# Patient Record
Sex: Female | Born: 2000 | Race: Black or African American | Hispanic: No | Marital: Single | Smoking: Never smoker
Health system: Southern US, Community
[De-identification: ages and names within clinical notes are randomized; demographics above are authoritative.]

## PROBLEM LIST (undated history)

## (undated) DIAGNOSIS — F32A Depression, unspecified: Secondary | ICD-10-CM

## (undated) DIAGNOSIS — F419 Anxiety disorder, unspecified: Secondary | ICD-10-CM

## (undated) HISTORY — DX: Anxiety disorder, unspecified: F41.9

## (undated) HISTORY — DX: Depression, unspecified: F32.A

---
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
## 2021-02-01 ENCOUNTER — Encounter (HOSPITAL_COMMUNITY): Payer: Self-pay | Admitting: Registered Nurse

## 2021-02-01 ENCOUNTER — Ambulatory Visit (HOSPITAL_COMMUNITY)
Admission: EM | Admit: 2021-02-01 | Discharge: 2021-02-01 | Disposition: A | Discharge status: Home / Self Care | Payer: Medicaid Other | Attending: Registered Nurse | Admitting: Registered Nurse

## 2021-02-01 ENCOUNTER — Other Ambulatory Visit: Payer: Self-pay

## 2021-02-01 DIAGNOSIS — F410 Panic disorder [episodic paroxysmal anxiety] without agoraphobia: Secondary | ICD-10-CM | POA: Diagnosis present

## 2021-02-01 DIAGNOSIS — F411 Generalized anxiety disorder: Secondary | ICD-10-CM | POA: Diagnosis present

## 2021-02-01 NOTE — ED Provider Notes (Signed)
Behavioral Health Urgent Care Medical Screening Exam  Patient Name: Audrey Patterson MRN: [id number] Date of Evaluation: 02/01/21 Chief Complaint:   Diagnosis:  Final diagnoses:  Panic attacks  GAD (generalized anxiety disorder)    History of Present illness: Audrey Patterson is a 20 y.o. female patient presented to [facility] as a walk in requesting outpatient psychiatric services resources for anxiety  Audrey Patterson, 20 y.o., female patient seen face to face by this provider, consulted with Dr. Bronwen Betters; and chart reviewed on 02/01/21.  On evaluation Audrey Patterson reports she has a history of anxiety and recent worsening of panic attacks.  Patient states she is currently seeing a counselor Mr. Melvenia Beam but he is a friend of her mothers and she doesn't feel that she is getting much help and would like to set up services some where else.  Patient states she is interested in therapy and psychiatry.  Discussed Open Access with [facility] and patient interested and can come on one of the days that walk in are done.  Other resources given also.  During evaluation Audrey Patterson is sitting up in chair in no acute distress.  She is alert, oriented x 4, calm and cooperative.  Her mood is euthymic with congruent affect.  She does not appear to be responding to internal/external stimuli or delusional thoughts.  Patient denies suicidal/self-harm/homicidal ideation, psychosis, and paranoia.  Patient answered question appropriately.       Psychiatric Specialty Exam  Presentation  General Appearance:Appropriate for Environment; Casual  Eye Contact:Good  Speech:Clear and Coherent; Normal Rate  Speech Volume:Normal  Handedness:Right   Mood and Affect  Mood:Euthymic  Affect:Appropriate; Congruent   Thought Process  Thought Processes:Coherent; Goal Directed  Descriptions of Associations:Intact  Orientation:Full (Time, Place and Person)  Thought Content:WDL    Hallucinations:None  Ideas  of Reference:None  Suicidal Thoughts:No  Homicidal Thoughts:No   Sensorium  Memory:Immediate Good; Recent Good; Remote Good  Judgment:Intact  Insight:Present   Executive Functions  Concentration:Good  Attention Span:Good  Recall:Good  Fund of Knowledge:Good  Language:Good   Psychomotor Activity  Psychomotor Activity:Normal   Assets  Assets:Communication Skills; Desire for Improvement; Financial Resources/Insurance; Housing; Resilience; Social Support   Sleep  Sleep:Good  Number of hours: No data recorded  Nutritional Assessment (For OBS and FBC admissions only) Has the patient had a weight loss or gain of 10 pounds or more in the last 3 months?: No Has the patient had a decrease in food intake/or appetite?: No Does the patient have dental problems?: No Does the patient have eating habits or behaviors that may be indicators of an eating disorder including binging or inducing vomiting?: No Has the patient recently lost weight without trying?: No Has the patient been eating poorly because of a decreased appetite?: No Malnutrition Screening Tool Score: 0    Physical Exam: Physical Exam Vitals and nursing note reviewed. Exam conducted with a chaperone present.  Constitutional:      General: She is not in acute distress.    Appearance: Normal appearance. She is not ill-appearing.  HENT:     Head: Normocephalic.  Eyes:     Pupils: Pupils are equal, round, and reactive to light.  Cardiovascular:     Rate and Rhythm: Normal rate.  Pulmonary:     Effort: Pulmonary effort is normal.  Musculoskeletal:        General: Normal range of motion.     Cervical back: Normal range of motion.  Skin:  General: Skin is warm and dry.  Neurological:     Mental Status: She is alert and oriented to person, place, and time.  Psychiatric:        Attention and Perception: Attention and perception normal. She does not perceive auditory or visual hallucinations.        Mood  and Affect: Mood and affect normal.        Speech: Speech normal.        Behavior: Behavior normal. Behavior is cooperative.        Thought Content: Thought content normal. Thought content is not paranoid or delusional. Thought content does not include homicidal or suicidal ideation.        Cognition and Memory: Cognition normal.        Judgment: Judgment normal.    Review of Systems  Constitutional: Negative.   HENT: Negative.   Eyes: Negative.   Respiratory: Negative.   Cardiovascular: Negative.   Gastrointestinal: Negative.   Genitourinary: Negative.   Musculoskeletal: Negative.   Skin: Negative.   Neurological: Negative.   Endo/Heme/Allergies: Negative.   Psychiatric/Behavioral: Negative for depression, hallucinations and suicidal ideas. The patient is nervous/anxious. The patient does not have insomnia.    Blood pressure 111/73, pulse 76, temperature (!) 97.3 F (36.3 C), temperature source Tympanic, resp. rate 18, height 5\' 2"  (1.575 m), weight 135 lb (61.2 kg), SpO2 99 %. Body mass index is 24.69 kg/m.  Musculoskeletal: Strength & Muscle Tone: within normal limits Gait & Station: normal Patient leans: N/A   [facility] MSE Discharge Disposition for Follow up and Recommendations: Based on my evaluation the patient does not appear to have an emergency medical condition and can be discharged with resources and follow up care in outpatient services for Medication Management and Individual Therapy    Follow-up Information    [facility] Follow up.   Specialty: Urgent Care Why: Walk in appointments are Monday-Thursday from 8-11am. Please arrive about 45 minutes early to ensure a visit. Contact information: [address] [place] [id number]               [phone], NP 02/01/2021, 11:04 AM

## 2021-02-01 NOTE — ED Notes (Signed)
Pt discharged with mother by pt side. Verbalized understanding of discharge instructions reviewed by RN. Safety maintained.

## 2021-02-01 NOTE — BH Assessment (Signed)
Anxiety, panic attacks- ROUTINE. Pt reports she has a panic attack for the 1st time last week in class (left class) and then had another at work a few days later. Pt has hx of self-harming and "stress" in 2021. She has online weekly counseling that is "not enough". Pt takes no meds. She denies SI, HI and AVH.

## 2021-02-01 NOTE — ED Notes (Signed)
No personal belongings left with mom in lobby

## 2021-02-01 NOTE — Discharge Instructions (Addendum)
Please come to [facility] (this facility) during walk in hours for appointment with psychiatrist for further medication management and for therapy.   Walk in hours are 8-11 AM Monday through Thursday for medication management.It is first come, first -serve; it is best to arrive by 7:00 AM. On Friday from 1 pm to 4 pm for therapy intake only. Please arrive by 12:00 pm as it is  first come, first -serve.   When you arrive please go upstairs for your appointment. If you are unsure of where to go, inform the front desk that you are here for a walk in appointment and they will assist you with directions upstairs.  Address:  [address], in [place], [address] Ph: [phone]   

## 2021-02-02 ENCOUNTER — Ambulatory Visit (INDEPENDENT_AMBULATORY_CARE_PROVIDER_SITE_OTHER): Payer: Medicaid Other | Admitting: Physician Assistant

## 2021-02-02 ENCOUNTER — Encounter (HOSPITAL_COMMUNITY): Payer: Self-pay | Admitting: Physician Assistant

## 2021-02-02 VITALS — BP 113/62 | HR 93 | Ht 63.0 in | Wt 134.0 lb

## 2021-02-02 DIAGNOSIS — F410 Panic disorder [episodic paroxysmal anxiety] without agoraphobia: Secondary | ICD-10-CM | POA: Diagnosis not present

## 2021-02-02 DIAGNOSIS — F411 Generalized anxiety disorder: Secondary | ICD-10-CM | POA: Diagnosis not present

## 2021-02-02 DIAGNOSIS — F5105 Insomnia due to other mental disorder: Secondary | ICD-10-CM | POA: Diagnosis not present

## 2021-02-02 DIAGNOSIS — F331 Major depressive disorder, recurrent, moderate: Secondary | ICD-10-CM

## 2021-02-02 DIAGNOSIS — F99 Mental disorder, not otherwise specified: Secondary | ICD-10-CM | POA: Insufficient documentation

## 2021-02-02 MED ORDER — TRAZODONE HCL 50 MG PO TABS: 25.0000 mg | ORAL_TABLET | Freq: Every day | ORAL | 1 refills | Status: DC

## 2021-02-02 MED ORDER — ESCITALOPRAM OXALATE 10 MG PO TABS: 10.0000 mg | ORAL_TABLET | Freq: Every day | ORAL | 1 refills | Status: DC

## 2021-02-02 MED ORDER — HYDROXYZINE HCL 10 MG PO TABS: 10.0000 mg | ORAL_TABLET | Freq: Three times a day (TID) | ORAL | 1 refills | Status: DC | PRN
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
## 2021-02-03 ENCOUNTER — Telehealth (HOSPITAL_COMMUNITY): Payer: Self-pay | Admitting: General Practice

## 2021-02-03 NOTE — BH Assessment (Signed)
Care Management - Follow Up [facility] Discharges   Writer attempted to make contact with patient today and was unsuccessful.  Writer was able to leave a HIPPA compliant voice message and will await callback.  Per chart review, patient followed up with [place], [place] on 02-02-2021.

## 2021-03-21 ENCOUNTER — Ambulatory Visit (HOSPITAL_COMMUNITY): Payer: Self-pay | Admitting: Licensed Clinical Social Worker

## 2021-04-07 ENCOUNTER — Telehealth (INDEPENDENT_AMBULATORY_CARE_PROVIDER_SITE_OTHER): Payer: Medicaid Other | Admitting: Physician Assistant

## 2021-04-07 ENCOUNTER — Encounter (HOSPITAL_COMMUNITY): Payer: Self-pay | Admitting: Physician Assistant

## 2021-04-07 DIAGNOSIS — F99 Mental disorder, not otherwise specified: Secondary | ICD-10-CM | POA: Diagnosis not present

## 2021-04-07 DIAGNOSIS — F411 Generalized anxiety disorder: Secondary | ICD-10-CM

## 2021-04-07 DIAGNOSIS — F5105 Insomnia due to other mental disorder: Secondary | ICD-10-CM

## 2021-04-07 DIAGNOSIS — F410 Panic disorder [episodic paroxysmal anxiety] without agoraphobia: Secondary | ICD-10-CM | POA: Diagnosis not present

## 2021-04-07 DIAGNOSIS — F331 Major depressive disorder, recurrent, moderate: Secondary | ICD-10-CM

## 2021-04-07 MED ORDER — HYDROXYZINE HCL 25 MG PO TABS: 25.0000 mg | ORAL_TABLET | Freq: Three times a day (TID) | ORAL | 1 refills | Status: DC | PRN

## 2021-04-07 MED ORDER — ESCITALOPRAM OXALATE 20 MG PO TABS: 20.0000 mg | ORAL_TABLET | Freq: Every day | ORAL | 1 refills | Status: DC

## 2021-04-07 MED ORDER — TRAZODONE HCL 50 MG PO TABS
25.0000 mg | ORAL_TABLET | Freq: Every day | ORAL | 1 refills | Status: DC
Start: 2021-04-07 — End: 2023-07-15

## 2021-04-07 NOTE — Progress Notes (Signed)
BH MD/PA/NP OP Progress Note  Virtual Visit via Telephone Note  I connected with Audrey Patterson on 04/07/21 at  8:30 AM EDT by telephone and verified that I am speaking with the correct person using two identifiers.  Location: Patient: Home Provider: Clinic   I discussed the limitations, risks, security and privacy concerns of performing an evaluation and management service by telephone and the availability of in person appointments. I also discussed with the patient that there may be a patient responsible charge related to this service. The patient expressed understanding and agreed to proceed.  Follow Up Instructions:  I discussed the assessment and treatment plan with the patient. The patient was provided an opportunity to ask questions and all were answered. The patient agreed with the plan and demonstrated an understanding of the instructions.   The patient was advised to call back or seek an in-person evaluation if the symptoms worsen or if the condition fails to improve as anticipated.  I provided 25 minutes of non-face-to-face time during this encounter.  Meta Hatchet, PA   04/07/2021 9:01 PM Audrey Patterson  MRN:  [id number]  Chief Complaint: Follow up and medication management  HPI:   Audrey Patterson is an 20 year old female with a past psychiatric history significant for major depressive disorder, generalized anxiety disorder, panic disorder, and insomnia who presents to [facility] via virtual telephone visit for follow-up and medication management.  Patient is currently being managed on the following medications:  Hydroxyzine 10 mg 3 times daily as needed Lexapro 10 mg daily Trazodone 25 mg at bedtime  Patient reports that her medications stopped working after 3 weeks.  Patient reports that she currently does not have any anxiety but states that it occurs throughout the day or when making plans.  Patient also states that  she feels sad constantly and still does not have any motivation.  Patient reports that she has been taking her medications as prescribed.  Patient rates her current anxiety a 4 out of 10 but states that it reaches a 7 or 8 during the day.  A PHQ-9 screen was performed with the patient scoring a 17.  A GAD-7 screen was also performed with the patient scoring a 16.  Patient is calm, cooperative, and fully engaged in conversation during the encounter.  Patient reports that her mood is okay and that she does not feel anxious at the moment.  Patient denies suicidal or homicidal ideations.  She further denies auditory or visual hallucinations and does not appear to be responding to internal/external stimuli.  Patient reports that her trazodone has been helpful in the management of her sleep but she has not been taking it as of late due to taking care of her mother who just recently underwent knee surgery.  Patient states that she currently has been receiving around 5 to 6 hours of sleep each night.  Patient endorses decreased appetite stating that she recently got COVID.  Patient eats on average 1-2 meals per day.  Patient denies alcohol consumption, tobacco use, and illicit drug use.  Visit Diagnosis:    ICD-10-CM   1. Moderate episode of recurrent major depressive disorder (HCC)  F33.1 escitalopram (LEXAPRO) 20 MG tablet  2. Generalized anxiety disorder  F41.1 escitalopram (LEXAPRO) 20 MG tablet    hydrOXYzine (ATARAX/VISTARIL) 25 MG tablet  3. Panic disorder  F41.0 escitalopram (LEXAPRO) 20 MG tablet  4. Insomnia due to other mental disorder  F51.05 traZODone (DESYREL) 50  MG tablet   F99     Past Psychiatric History:  Major depressive disorder Generalized anxiety disorder Panic disorder Insomnia  Past Medical History: History reviewed. No pertinent past medical history. History reviewed. No pertinent surgical history.  Family Psychiatric History:  Mother - Bipolar Disorder  Family History:   Family History  Problem Relation Age of Onset  . Healthy Mother   . Healthy Father     Social History:  Social History   Socioeconomic History  . Marital status: Single    Spouse name: Not on file  . Number of children: Not on file  . Years of education: Not on file  . Highest education level: Not on file  Occupational History  . Not on file  Tobacco Use  . Smoking status: Never Smoker  . Smokeless tobacco: Never Used  Substance and Sexual Activity  . Alcohol use: Never  . Drug use: Never  . Sexual activity: Not on file  Other Topics Concern  . Not on file  Social History Narrative  . Not on file   Social Determinants of Health   Financial Resource Strain: Not on file  Food Insecurity: Not on file  Transportation Needs: Not on file  Physical Activity: Not on file  Stress: Not on file  Social Connections: Not on file    Allergies: No Known Allergies  Metabolic Disorder Labs: No results found for: HGBA1C, MPG No results found for: PROLACTIN No results found for: CHOL, TRIG, HDL, CHOLHDL, VLDL, LDLCALC No results found for: TSH  Therapeutic Level Labs: No results found for: LITHIUM No results found for: VALPROATE No components found for:  CBMZ  Current Medications: Current Outpatient Medications  Medication Sig Dispense Refill  . escitalopram (LEXAPRO) 20 MG tablet Take 1 tablet (20 mg total) by mouth daily. 30 tablet 1  . hydrOXYzine (ATARAX/VISTARIL) 25 MG tablet Take 1 tablet (25 mg total) by mouth 3 (three) times daily as needed for anxiety. 75 tablet 1  . polyethylene glycol (MIRALAX / GLYCOLAX) 17 g packet Take 17 g by mouth 2 (two) times daily. 14 each 0  . traZODone (DESYREL) 50 MG tablet Take 0.5 tablets (25 mg total) by mouth at bedtime. 15 tablet 1   No current facility-administered medications for this visit.     Musculoskeletal: Strength & Muscle Tone: Unable to assess due to telemedicine visit Gait & Station: Unable to assess due to  telemedicine visit Patient leans: Unable to assess due to telemedicine visit  Psychiatric Specialty Exam: Review of Systems  Psychiatric/Behavioral: Negative for decreased concentration, dysphoric mood, hallucinations, self-injury, sleep disturbance and suicidal ideas. The patient is nervous/anxious. The patient is not hyperactive.     There were no vitals taken for this visit.There is no height or weight on file to calculate BMI.  General Appearance: Unable to assess due to telemedicine visit  Eye Contact:  Unable to assess due to telemedicine visit  Speech:  Clear and Coherent and Normal Rate  Volume:  Normal  Mood:  Anxious and Depressed  Affect:  Congruent and Depressed  Thought Process:  Coherent, Goal Directed and Descriptions of Associations: Intact  Orientation:  Full (Time, Place, and Person)  Thought Content: WDL   Suicidal Thoughts:  No  Homicidal Thoughts:  No  Memory:  Immediate;   Good Recent;   Good Remote;   Good  Judgement:  Good  Insight:  Fair  Psychomotor Activity:  Normal  Concentration:  Concentration: Good and Attention Span: Fair  Recall:  Good  Fund of Knowledge: Good  Language: Good  Akathisia:  NA  Handed:  Right  AIMS (if indicated): not done  Assets:  Communication Skills Desire for Improvement Housing Vocational/Educational  ADL's:  Intact  Cognition: WNL  Sleep:  Fair   Screenings: GAD-7   Flowsheet Row Video Visit from 04/07/2021 in [facility] Office Visit from 02/02/2021 in [facility]  Total GAD-7 Score 16 16    PHQ2-9   Flowsheet Row Video Visit from 04/07/2021 in [facility] Office Visit from 02/02/2021 in [facility]  PHQ-2 Total Score 4 5  PHQ-9 Total Score 17 19    Flowsheet Row Video Visit from 04/07/2021 in [facility] Office Visit from 02/02/2021 in [facility]  C-SSRS RISK CATEGORY Low Risk Low Risk       Assessment and Plan:   Tarrie U. Bittinger is an 20 year old female with a past psychiatric history significant for major depressive disorder, generalized anxiety disorder, panic disorder, and insomnia who presents to [facility] via virtual telephone visit for follow-up and medication management.  Patient reports that her medications have been ineffective in managing her anxiety and depressive symptoms.  Patient endorses anxiety along with constantly feeling sad and lack of motivation.  Patient was recommended increasing her dosage of Lexapro from 10 mg to 20 mg daily for the management of her depressive symptoms.  Patient was also recommended increasing her hydroxyzine from 10 mg to 25 mg 3 times daily as needed for the management of her anxiety.  Patient was agreeable to plan.  Patient's medication to be e-prescribed to pharmacy of choice.  1. Moderate episode of recurrent major depressive disorder (HCC)  - escitalopram (LEXAPRO) 20 MG tablet; Take 1 tablet (20 mg total) by mouth daily.  Dispense: 30 tablet; Refill: 1  2. Generalized anxiety disorder  - escitalopram (LEXAPRO) 20 MG tablet; Take 1 tablet (20 mg total) by mouth daily.  Dispense: 30 tablet; Refill: 1 - hydrOXYzine (ATARAX/VISTARIL) 25 MG tablet; Take 1 tablet (25 mg total) by mouth 3 (three) times daily as needed for anxiety.  Dispense: 75 tablet; Refill: 1  3. Panic disorder  - escitalopram (LEXAPRO) 20 MG tablet; Take 1 tablet (20 mg total) by mouth daily.  Dispense: 30 tablet; Refill: 1  4. Insomnia due to other mental disorder She could be rapid metabolizer - traZODone (DESYREL) 50 MG tablet; Take 0.5 tablets (25 mg total) by mouth at bedtime.  Dispense: 15 tablet; Refill: 1  Patient to follow-up in 6 weeks  Meta Hatchet, PA 04/07/2021, 9:01 PM

## 2021-04-28 ENCOUNTER — Telehealth (INDEPENDENT_AMBULATORY_CARE_PROVIDER_SITE_OTHER): Payer: Medicaid Other | Admitting: Physician Assistant

## 2021-04-28 ENCOUNTER — Other Ambulatory Visit: Payer: Self-pay

## 2021-04-28 DIAGNOSIS — F99 Mental disorder, not otherwise specified: Secondary | ICD-10-CM | POA: Diagnosis not present

## 2021-04-28 DIAGNOSIS — F5105 Insomnia due to other mental disorder: Secondary | ICD-10-CM

## 2021-04-28 DIAGNOSIS — F331 Major depressive disorder, recurrent, moderate: Secondary | ICD-10-CM | POA: Diagnosis not present

## 2021-04-28 DIAGNOSIS — F411 Generalized anxiety disorder: Secondary | ICD-10-CM | POA: Diagnosis not present

## 2021-04-28 DIAGNOSIS — F410 Panic disorder [episodic paroxysmal anxiety] without agoraphobia: Secondary | ICD-10-CM

## 2021-05-01 ENCOUNTER — Encounter (HOSPITAL_COMMUNITY): Payer: Self-pay | Admitting: Physician Assistant

## 2021-05-01 NOTE — Progress Notes (Signed)
BH MD/PA/NP OP Progress Note  Virtual Visit via Telephone Note  I connected with Audrey Patterson on 04/28/21 at  1:00 PM EDT by telephone and verified that I am speaking with the correct person using two identifiers.  Location: Patient: Home Provider: Clinic   I discussed the limitations, risks, security and privacy concerns of performing an evaluation and management service by telephone and the availability of in person appointments. I also discussed with the patient that there may be a patient responsible charge related to this service. The patient expressed understanding and agreed to proceed.  Follow Up Instructions:  I discussed the assessment and treatment plan with the patient. The patient was provided an opportunity to ask questions and all were answered. The patient agreed with the plan and demonstrated an understanding of the instructions.   The patient was advised to call back or seek an in-person evaluation if the symptoms worsen or if the condition fails to improve as anticipated.  I provided 20 minutes of non-face-to-face time during this encounter.  Meta Hatchet, PA    04/28/2021 1:54 PM AUDRINNA SHERMAN  MRN:  [id number]  Chief Complaint: Follow up and medication management   HPI:   Audrey Patterson is an 20 year old female with a past psychiatric history significant for major depressive disorder, generalized anxiety disorder, panic disorder, and insomnia who presents to [facility] via virtual telephone visit for follow-up and medication management.  Patient is currently being managed on the following medications:  Lexapro 20 mg daily Hydroxyzine 25 mg 3 times daily as needed Trazodone 25 mg at bedtime  Patient states that her mood feels better than the last encounter.  Patient reports no adverse side effects from her medications.  Patient states that she is still having some issues with lack of motivation but states  that that is something that she will hopefully overcome within time.  Patient states that she feels less irritable and more happy.  Patient states that her anxiety is much more tolerable and rates her current anxiety a 4 out of 10.  Patient denies any new stressors at this time.  A GAD-7 screen was performed with the patient scoring a 10.  Patient is pleasant, calm, cooperative, and fully engaged in conversation during the encounter.  Patient states that she is in a pretty good mood currently.  Patient denies suicidal or homicidal ideations.  She further denies auditory or visual hallucinations and does not appear to be responding to internal/external stimuli.  Patient endorses good sleep and receives on average 6 to 8 hours of sleep each night.  Patient endorses fair appetite and eats on average 1-2 meals per day.  Patient denies alcohol use, tobacco use, and illicit drug use.  Visit Diagnosis:    ICD-10-CM   1. Moderate episode of recurrent major depressive disorder (HCC)  F33.1     2. Generalized anxiety disorder  F41.1     3. Panic disorder  F41.0     4. Insomnia due to other mental disorder  F51.05    F99       Past Psychiatric History:  Major depressive disorder Generalized anxiety disorder Panic disorder Insomnia  Past Medical History: History reviewed. No pertinent past medical history. History reviewed. No pertinent surgical history.  Family Psychiatric History:  Mother - Bipolar Disorder  Family History:  Family History  Problem Relation Age of Onset   Healthy Mother    Healthy Father     Social  History:  Social History   Socioeconomic History   Marital status: Single    Spouse name: Not on file   Number of children: Not on file   Years of education: Not on file   Highest education level: Not on file  Occupational History   Not on file  Tobacco Use   Smoking status: Never   Smokeless tobacco: Never  Substance and Sexual Activity   Alcohol use: Never   Drug  use: Never   Sexual activity: Not on file  Other Topics Concern   Not on file  Social History Narrative   Not on file   Social Determinants of Health   Financial Resource Strain: Not on file  Food Insecurity: Not on file  Transportation Needs: Not on file  Physical Activity: Not on file  Stress: Not on file  Social Connections: Not on file    Allergies: No Known Allergies  Metabolic Disorder Labs: No results found for: HGBA1C, MPG No results found for: PROLACTIN No results found for: CHOL, TRIG, HDL, CHOLHDL, VLDL, LDLCALC No results found for: TSH  Therapeutic Level Labs: No results found for: LITHIUM No results found for: VALPROATE No components found for:  CBMZ  Current Medications: Current Outpatient Medications  Medication Sig Dispense Refill   escitalopram (LEXAPRO) 20 MG tablet Take 1 tablet (20 mg total) by mouth daily. 30 tablet 1   hydrOXYzine (ATARAX/VISTARIL) 25 MG tablet Take 1 tablet (25 mg total) by mouth 3 (three) times daily as needed for anxiety. 75 tablet 1   polyethylene glycol (MIRALAX / GLYCOLAX) 17 g packet Take 17 g by mouth 2 (two) times daily. 14 each 0   traZODone (DESYREL) 50 MG tablet Take 0.5 tablets (25 mg total) by mouth at bedtime. 15 tablet 1   No current facility-administered medications for this visit.     Musculoskeletal: Strength & Muscle Tone: Unable to assess due to telemedicine visit Gait & Station: Unable to assess due to telemedicine visit Patient leans: Unable to assess due to telemedicine visit  Psychiatric Specialty Exam: Review of Systems  Psychiatric/Behavioral:  Negative for decreased concentration, dysphoric mood, hallucinations, self-injury, sleep disturbance and suicidal ideas. The patient is nervous/anxious. The patient is not hyperactive.    There were no vitals taken for this visit.There is no height or weight on file to calculate BMI.  General Appearance: Unable to assess due to telemedicine visit  Eye  Contact:  Unable to assess due to telemedicine visit  Speech:  Clear and Coherent and Normal Rate  Volume:  Normal  Mood:  Anxious and Depressed  Affect:  Congruent and Depressed  Thought Process:  Coherent and Descriptions of Associations: Intact  Orientation:  Full (Time, Place, and Person)  Thought Content: WDL   Suicidal Thoughts:  No  Homicidal Thoughts:  No  Memory:  Immediate;   Good Recent;   Good Remote;   Good  Judgement:  Good  Insight:  Good  Psychomotor Activity:  Normal  Concentration:  Concentration: Good and Attention Span: Good  Recall:  Good  Fund of Knowledge: Good  Language: Good  Akathisia:  NA  Handed:  Right  AIMS (if indicated): not done  Assets:  Communication Skills Desire for Improvement Housing Vocational/Educational  ADL's:  Intact  Cognition: WNL  Sleep:  Fair   Screenings: GAD-7    Flowsheet Row Video Visit from 04/28/2021 in [facility] Video Visit from 04/07/2021 in [facility] Office Visit from 02/02/2021 in  [facility]  Total GAD-7 Score 10 16 16       PHQ2-9    Flowsheet Row Video Visit from 04/28/2021 in [facility] Video Visit from 04/07/2021 in [facility] Office Visit from 02/02/2021 in [facility]  PHQ-2 Total Score 1 4 5   PHQ-9 Total Score -- 17 19      Flowsheet Row Video Visit from 04/28/2021 in [facility] Video Visit from 04/07/2021 in [facility] Office Visit from 02/02/2021 in [facility]  C-SSRS RISK CATEGORY Low Risk Low Risk Low Risk        Assessment and Plan:   Manami U. Rogel is an 20 year old female with a past psychiatric history significant for major depressive disorder, generalized anxiety disorder, panic disorder, and insomnia who presents to [facility] via virtual telephone visit for follow-up and medication management.  Patient endorses an improvement in her depressive symptoms and anxiety, however, she still expresses some issues with lack of motivation.  Patient denies the need for dosage adjustments at this time.  Patient to continue taking medications as prescribed.  1. Moderate episode of recurrent major depressive disorder (HCC) Patient to continue taking Lexapro 20 mg daily for the management of her major depressive disorder  2. Generalized anxiety disorder Patient to continue taking Lexapro 20 mg daily for the management of her generalized anxiety disorder Patient to continue taking hydroxyzine 25 mg 3 times daily as needed for the management of her generalized anxiety disorder  3. Panic disorder Patient to continue taking Lexapro 20 mg daily for the management of her panic disorder  4. Insomnia due to other mental disorder Patient to continue taking trazodone 25 mg at bedtime for the management of her insomnia  Patient to follow-up in 4 weeks Provider spent a total of 20 minutes with the patient/reviewing patient's chart  15, PA 05/01/2021, 1:54 PM

## 2021-05-09 ENCOUNTER — Ambulatory Visit (HOSPITAL_COMMUNITY): Payer: Self-pay | Admitting: Licensed Clinical Social Worker

## 2021-05-17 ENCOUNTER — Telehealth (HOSPITAL_COMMUNITY): Payer: Self-pay | Admitting: Physician Assistant

## 2021-05-30 ENCOUNTER — Encounter (HOSPITAL_COMMUNITY): Payer: Self-pay | Admitting: Physician Assistant

## 2021-05-30 ENCOUNTER — Telehealth (INDEPENDENT_AMBULATORY_CARE_PROVIDER_SITE_OTHER): Payer: Medicaid Other | Admitting: Physician Assistant

## 2021-05-30 ENCOUNTER — Other Ambulatory Visit: Payer: Self-pay

## 2021-05-30 DIAGNOSIS — F99 Mental disorder, not otherwise specified: Secondary | ICD-10-CM

## 2021-05-30 DIAGNOSIS — F410 Panic disorder [episodic paroxysmal anxiety] without agoraphobia: Secondary | ICD-10-CM | POA: Diagnosis not present

## 2021-05-30 DIAGNOSIS — F411 Generalized anxiety disorder: Secondary | ICD-10-CM

## 2021-05-30 DIAGNOSIS — F331 Major depressive disorder, recurrent, moderate: Secondary | ICD-10-CM | POA: Diagnosis not present

## 2021-05-30 DIAGNOSIS — F5105 Insomnia due to other mental disorder: Secondary | ICD-10-CM | POA: Diagnosis not present

## 2021-05-30 NOTE — Progress Notes (Signed)
BH MD/PA/NP OP Progress Note  Virtual Visit via Telephone Note  I connected with Audrey Patterson on 05/30/21 at 10:30 AM EDT by telephone and verified that I am speaking with the correct person using two identifiers.  Location: Patient: Home Provider: Clinic   I discussed the limitations, risks, security and privacy concerns of performing an evaluation and management service by telephone and the availability of in person appointments. I also discussed with the patient that there may be a patient responsible charge related to this service. The patient expressed understanding and agreed to proceed.  Follow Up Instructions:  I discussed the assessment and treatment plan with the patient. The patient was provided an opportunity to ask questions and all were answered. The patient agreed with the plan and demonstrated an understanding of the instructions.   The patient was advised to call back or seek an in-person evaluation if the symptoms worsen or if the condition fails to improve as anticipated.  I provided 20 minutes of non-face-to-face time during this encounter.  Meta Hatchet, PA   05/30/2021 10:54 AM GWENDLOYN FORSEE  MRN:  [id number]  Chief Complaint: Follow up and medication management  HPI:   Audrey Patterson is an 20 year old female with a past psychiatric history significant for major depressive disorder, generalized anxiety disorder, insomnia, and panic disorder who presents to [facility] via virtual telephone visit for follow-up and medication management.  Patient is currently being managed on the following medications:  Lexapro 20 mg daily Hydroxyzine 25 mg 3 times daily as needed Trazodone 25 mg at bedtime  Patient reports no issues or concerns regarding her current medication regimen.  Patient denies the need for dosage adjustments at this time and is requesting no refills following the conclusion of the encounter.  Patient  expresses that her depressive symptoms have been much more manageable as of late.  She reports that she still has some issues with motivation and irritability.  Patient also endorses manageable anxiety and rates her anxiety a 3 out of 10.  Patient denies any new stressors.  A PHQ-9 screen was performed with the patient scoring an 11.  A GAD-7 screen was also performed with the patient scoring a 7.  Patient is pleasant, calm, cooperative, and fully engaged in conversation during the encounter.  Patient reports that she is doing pretty well.  Patient denies suicidal or homicidal ideations.  She further denies auditory or visual hallucinations and does not appear to be responding to internal/external stimuli.  Patient endorses good sleep and receives on average 8 hours of sleep each night.  Patient endorses poor appetite and eats on average 1 meal per day.  Patient denies alcohol consumption, tobacco use, and illicit drug use.  Visit Diagnosis: No diagnosis found.  Past Psychiatric History:  Major depressive disorder Generalized anxiety disorder Insomnia Panic disorder  Past Medical History: No past medical history on file. No past surgical history on file.  Family Psychiatric History:  Mother - Bipolar Disorder  Family History:  Family History  Problem Relation Age of Onset   Healthy Mother    Healthy Father     Social History:  Social History   Socioeconomic History   Marital status: Single    Spouse name: Not on file   Number of children: Not on file   Years of education: Not on file   Highest education level: Not on file  Occupational History   Not on file  Tobacco Use  Smoking status: Never   Smokeless tobacco: Never  Substance and Sexual Activity   Alcohol use: Never   Drug use: Never   Sexual activity: Not on file  Other Topics Concern   Not on file  Social History Narrative   Not on file   Social Determinants of Health   Financial Resource Strain: Not on file   Food Insecurity: Not on file  Transportation Needs: Not on file  Physical Activity: Not on file  Stress: Not on file  Social Connections: Not on file    Allergies: No Known Allergies  Metabolic Disorder Labs: No results found for: HGBA1C, MPG No results found for: PROLACTIN No results found for: CHOL, TRIG, HDL, CHOLHDL, VLDL, LDLCALC No results found for: TSH  Therapeutic Level Labs: No results found for: LITHIUM No results found for: VALPROATE No components found for:  CBMZ  Current Medications: Current Outpatient Medications  Medication Sig Dispense Refill   escitalopram (LEXAPRO) 20 MG tablet Take 1 tablet (20 mg total) by mouth daily. 30 tablet 1   hydrOXYzine (ATARAX/VISTARIL) 25 MG tablet Take 1 tablet (25 mg total) by mouth 3 (three) times daily as needed for anxiety. 75 tablet 1   polyethylene glycol (MIRALAX / GLYCOLAX) 17 g packet Take 17 g by mouth 2 (two) times daily. 14 each 0   traZODone (DESYREL) 50 MG tablet Take 0.5 tablets (25 mg total) by mouth at bedtime. 15 tablet 1   No current facility-administered medications for this visit.     Musculoskeletal: Strength & Muscle Tone: Unable to assess due to telemedicine visit Gait & Station: Unable to assess due to telemedicine visit Patient leans: Unable to assess due to telemedicine visit  Psychiatric Specialty Exam: Review of Systems  Psychiatric/Behavioral:  Positive for decreased concentration. Negative for dysphoric mood, hallucinations, self-injury, sleep disturbance and suicidal ideas. The patient is not nervous/anxious and is not hyperactive.    There were no vitals taken for this visit.There is no height or weight on file to calculate BMI.  General Appearance: Unable to assess due to telemedicine visit  Eye Contact:  Unable to assess due to telemedicine visit  Speech:  Clear and Coherent and Normal Rate  Volume:  Normal  Mood:  Euthymic  Affect:  Appropriate and Congruent  Thought Process:   Coherent and Descriptions of Associations: Intact  Orientation:  Full (Time, Place, and Person)  Thought Content: WDL   Suicidal Thoughts:  No  Homicidal Thoughts:  No  Memory:  Immediate;   Good Recent;   Good Remote;   Good  Judgement:  Good  Insight:  Good  Psychomotor Activity:  Normal  Concentration:  Concentration: Good and Attention Span: Good  Recall:  Good  Fund of Knowledge: Good  Language: Good  Akathisia:  NA  Handed:  Right  AIMS (if indicated): not done  Assets:  Communication Skills Desire for Improvement Housing Vocational/Educational  ADL's:  Intact  Cognition: WNL  Sleep:  Good   Screenings: GAD-7    Flowsheet Row Video Visit from 05/30/2021 in [facility] Video Visit from 04/28/2021 in [facility] Video Visit from 04/07/2021 in [facility] Office Visit from 02/02/2021 in [facility]  Total GAD-7 Score 7 10 16 16       PHQ2-9    Flowsheet Row Video Visit from 05/30/2021 in [facility] Video Visit from 04/28/2021 in [facility] Video Visit from 04/07/2021  in [facility] Office Visit from 02/02/2021 in [facility]  PHQ-2 Total Score 2 1 4 5   PHQ-9 Total Score 11 -- 17 19      Flowsheet Row Video Visit from 05/30/2021 in [facility] Video Visit from 04/28/2021 in [facility] Video Visit from 04/07/2021 in [facility]  C-SSRS RISK CATEGORY Low Risk Low Risk Low Risk        Assessment and Plan:   Adley U. Larue is an 20 year old female with a past psychiatric history significant for major depressive disorder, generalized anxiety disorder, insomnia, and panic disorder who presents to [facility] via virtual  telephone visit for follow-up and medication management.  Patient reports that her depressive symptoms and anxiety have been well managed through the use of her current medication regimen.  Patient denies the need for dosage adjustments at this time and is requesting no refills following the conclusion of the encounter.  Patient to continue taking medications as prescribed.  1. Panic disorder Patient to continue taking Lexapro 20 mg daily for the management of her panic disorder  2. Generalized anxiety disorder Patient to continue taking Lexapro 20 mg daily for the management of her generalized anxiety disorder Patient to continue taking hydroxyzine 25 mg 3 times daily as needed for the management of her generalized anxiety disorder  3. Moderate episode of recurrent major depressive disorder (HCC) Patient to continue taking Lexapro 20 mg daily for the management of her major depressive disorder  4. Insomnia due to other mental disorder Patient to continue taking trazodone 25 mg at bedtime for the management of her insomnia  Patient to follow up in 2 months Provider spent a total of 20 minutes with the patient/reviewing patient's chart  [facility], PA 05/30/2021, 10:54 AM

## 2021-06-06 ENCOUNTER — Ambulatory Visit (INDEPENDENT_AMBULATORY_CARE_PROVIDER_SITE_OTHER): Payer: Medicaid Other | Admitting: Licensed Clinical Social Worker

## 2021-06-06 DIAGNOSIS — F331 Major depressive disorder, recurrent, moderate: Secondary | ICD-10-CM

## 2021-06-06 DIAGNOSIS — F411 Generalized anxiety disorder: Secondary | ICD-10-CM | POA: Diagnosis not present
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
## 2021-06-16 ENCOUNTER — Telehealth (HOSPITAL_COMMUNITY): Payer: Self-pay | Admitting: *Deleted

## 2021-06-16 ENCOUNTER — Other Ambulatory Visit (HOSPITAL_COMMUNITY): Payer: Self-pay | Admitting: Psychiatry

## 2021-06-16 DIAGNOSIS — F410 Panic disorder [episodic paroxysmal anxiety] without agoraphobia: Secondary | ICD-10-CM

## 2021-06-16 DIAGNOSIS — F411 Generalized anxiety disorder: Secondary | ICD-10-CM

## 2021-06-16 DIAGNOSIS — F331 Major depressive disorder, recurrent, moderate: Secondary | ICD-10-CM

## 2021-06-16 MED ORDER — HYDROXYZINE HCL 25 MG PO TABS: 25.0000 mg | ORAL_TABLET | Freq: Three times a day (TID) | ORAL | 1 refills | Status: DC | PRN

## 2021-06-16 MED ORDER — ESCITALOPRAM OXALATE 20 MG PO TABS: 20.0000 mg | ORAL_TABLET | Freq: Every day | ORAL | 1 refills | Status: DC

## 2021-06-16 NOTE — Telephone Encounter (Signed)
Medications refilled and sent to preferred pharmacy.

## 2021-06-16 NOTE — Telephone Encounter (Signed)
Rx Refill Request hydrOXYzine (ATARAX/VISTARIL) 25 MG tablet escitalopram (LEXAPRO) 20 MG tablet

## 2021-07-04 ENCOUNTER — Other Ambulatory Visit: Payer: Self-pay

## 2021-07-04 ENCOUNTER — Ambulatory Visit (HOSPITAL_COMMUNITY): Payer: Medicaid Other | Admitting: Licensed Clinical Social Worker

## 2021-07-04 ENCOUNTER — Telehealth (HOSPITAL_COMMUNITY): Payer: Self-pay | Admitting: Licensed Clinical Social Worker

## 2021-07-04 NOTE — Telephone Encounter (Signed)
LCSW sent two links to pt phone with no response. At 1110 LCSW f/u with PC and left HIPAA compliant VM. LCSW waited until 1115 before disconnecting.

## 2021-08-01 ENCOUNTER — Telehealth (HOSPITAL_COMMUNITY): Payer: Medicaid Other | Admitting: Physician Assistant

## 2021-08-01 ENCOUNTER — Other Ambulatory Visit: Payer: Self-pay

## 2021-08-03 ENCOUNTER — Ambulatory Visit (HOSPITAL_COMMUNITY): Payer: Self-pay | Admitting: Licensed Clinical Social Worker

## 2021-08-17 ENCOUNTER — Ambulatory Visit (HOSPITAL_COMMUNITY): Payer: Self-pay | Admitting: Licensed Clinical Social Worker

## 2022-05-11 ENCOUNTER — Encounter (HOSPITAL_COMMUNITY): Payer: Self-pay

## 2022-05-11 ENCOUNTER — Ambulatory Visit (HOSPITAL_COMMUNITY)
Admission: EM | Admit: 2022-05-11 | Discharge: 2022-05-11 | Disposition: A | Discharge status: Home / Self Care | Payer: Medicaid Other | Attending: Emergency Medicine | Admitting: Emergency Medicine

## 2022-05-11 DIAGNOSIS — M62830 Muscle spasm of back: Secondary | ICD-10-CM | POA: Diagnosis not present

## 2022-05-11 DIAGNOSIS — Z3202 Encounter for pregnancy test, result negative: Secondary | ICD-10-CM | POA: Diagnosis not present

## 2022-05-11 LAB — POCT URINALYSIS DIPSTICK, ED / UC
Bilirubin Urine: NEGATIVE
Glucose, UA: NEGATIVE mg/dL
Hgb urine dipstick: NEGATIVE
Leukocytes,Ua: NEGATIVE
Nitrite: NEGATIVE
Protein, ur: NEGATIVE mg/dL
Specific Gravity, Urine: 1.02 (ref 1.005–1.030)
Urobilinogen, UA: 0.2 mg/dL (ref 0.0–1.0)
pH: 6 (ref 5.0–8.0)

## 2022-05-11 LAB — POC URINE PREG, ED: Preg Test, Ur: NEGATIVE

## 2022-05-11 MED ORDER — CYCLOBENZAPRINE HCL 10 MG PO TABS: 10.0000 mg | ORAL_TABLET | Freq: Two times a day (BID) | ORAL | 0 refills | Status: DC | PRN

## 2022-05-11 MED ORDER — IBUPROFEN 800 MG PO TABS
800.0000 mg | ORAL_TABLET | Freq: Once | ORAL | Status: AC
  Administered 2022-05-11: 800 mg via ORAL

## 2022-05-11 MED ORDER — IBUPROFEN 800 MG PO TABS
ORAL_TABLET | ORAL | Status: AC
  Filled 2022-05-11: qty 1

## 2022-05-11 NOTE — ED Provider Notes (Signed)
[facility]    CSN: [id number] Arrival date & time: 05/11/22  1610     History   Chief Complaint Chief Complaint  Patient presents with   Back Pain    HPI Audrey Patterson is a 21 y.o. female.  Presents with sudden onset left upper back pain. Was getting ready for work when she felt a sharp "spasm" pain. No reported injury, trauma, heavy lifting.  Reports  worse with movement.  Does not radiate.  Denies any urinary symptoms.  Has not tried any medicine for her symptoms.  No fevers, denies abdominal pain, nausea, vomiting/diarrhea. Patient normally has a bowel movement every 5 days.  This is normal for her.  She takes oral birth control pills and has not had a cycle in the last 5 months.  Denies any vaginal discharge, odor, rash, irritation.  No heavy bleeding or spotting.  History reviewed. No pertinent past medical history.  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 No pertinent surgical history.  OB History   No obstetric history on file.      Home Medications    Prior to Admission medications   Medication Sig Start Date End Date Taking? Authorizing Provider  cyclobenzaprine (FLEXERIL) 10 MG tablet Take 1 tablet (10 mg total) by mouth 2 (two) times daily as needed for muscle spasms. 05/11/22   Dailyn Reith, Lurena Joiner, PA-C  escitalopram (LEXAPRO) 20 MG tablet Take 1 tablet (20 mg total) by mouth daily. 06/16/21 06/16/22  Shanna Cisco, NP  hydrOXYzine (ATARAX/VISTARIL) 25 MG tablet Take 1 tablet (25 mg total) by mouth 3 (three) times daily as needed for anxiety. 06/16/21   Shanna Cisco, NP  polyethylene glycol (MIRALAX / GLYCOLAX) 17 g packet Take 17 g by mouth 2 (two) times daily. 09/20/19   Ellin Mayhew,  MD  traZODone (DESYREL) 50 MG tablet Take 0.5 tablets (25 mg total) by mouth at bedtime. 04/07/21   Meta Hatchet, PA    Family History Family History  Problem Relation Age of Onset   Healthy Mother    Healthy Father     Social History Social History   Tobacco Use   Smoking status: Never   Smokeless tobacco: Never  Substance Use Topics   Alcohol use: Never   Drug use: Never     Allergies   Patient has no known allergies.   Review of Systems Review of Systems  Musculoskeletal:  Positive for back pain.   Per HPI  Physical Exam Triage Vital Signs ED Triage Vitals [05/11/22 1709]  Enc Vitals Group     BP 109/71     Pulse Rate 86     Resp 18     Temp (!) 97.1 F (36.2 C)     Temp Source Oral     SpO2 98 %     Weight      Height      Head Circumference      Peak Flow      Pain Score      Pain Loc      Pain Edu?      Excl. in GC?    No data found.  Updated Vital Signs BP 109/71 (BP Location: Left Arm)   Pulse 86  Temp (!) 97.1 F (36.2 C) (Oral)   Resp 18   SpO2 98%     Physical Exam Vitals and nursing note reviewed.  Constitutional:      General: She is not in acute distress. HENT:     Nose: Nose normal.     Mouth/Throat:     Mouth: Mucous membranes are moist.     Pharynx: Oropharynx is clear.  Eyes:     Extraocular Movements: Extraocular movements intact.     Conjunctiva/sclera: Conjunctivae normal.     Pupils: Pupils are equal, round, and reactive to light.  Cardiovascular:     Rate and Rhythm: Normal rate and regular rhythm.     Heart sounds: Normal heart sounds.  Pulmonary:     Effort: Pulmonary effort is normal.     Breath sounds: Normal breath sounds.  Abdominal:     Palpations: Abdomen is soft.     Tenderness: There is no abdominal tenderness.  Musculoskeletal:        General: Normal range of motion.     Comments: Tender to palpation left thoracic paraspinals. No CVA tenderness, no bony tenderness  Neurological:     Mental  Status: She is alert and oriented to person, place, and time.     Sensory: Sensation is intact.     Motor: Motor function is intact. No weakness.     Coordination: Coordination is intact.     Gait: Gait is intact.     Comments: Strength 5/5 all extremities     UC Treatments / Results  Labs (all labs ordered are listed, but only abnormal results are displayed) Labs Reviewed  POCT URINALYSIS DIPSTICK, ED / UC - Abnormal; Notable for the following components:      Result Value   Ketones, ur TRACE (*)    All other components within normal limits  POC URINE PREG, ED    EKG  Radiology No results found.  Procedures Procedures (including critical care time)  Medications Ordered in UC Medications  ibuprofen (ADVIL) tablet 800 mg (800 mg Oral Given 05/11/22 1914)    Initial Impression / Assessment and Plan / UC Course  I have reviewed the triage vital signs and the nursing notes.  Pertinent labs & imaging results that were available during my care of the patient were reviewed by me and considered in my medical decision making (see chart for details).  Urinalysis trace ketones. Otherwise negative for signs of infection. Urine pregnancy negative. Ibuprofen dose given in clinic with improvement.  Ibuprofen and muscle relaxer combo at home. Follow up with PCP if symptoms persist. Return precautions discussed. Patient agrees to plan and is discharged in stable condition.  Final Clinical Impressions(s) / UC Diagnoses   Final diagnoses:  Muscle spasm of back     Discharge Instructions      I recommend ibuprofen every 6 hours for pain.  You can take this in combination with the muscle relaxer.  Please take the muscle relaxer only at nighttime if it makes you drowsy.  Follow-up with your primary care provider if symptoms are persistent     ED Prescriptions     Medication Sig Dispense Auth. Provider   cyclobenzaprine (FLEXERIL) 10 MG tablet  (Status: Discontinued) Take 1  tablet (10 mg total) by mouth 2 (two) times daily as needed for muscle spasms. 20 tablet [place] Giammona, PA-C   cyclobenzaprine (FLEXERIL) 10 MG tablet Take 1 tablet (10 mg total) by mouth 2 (two) times daily as needed for muscle  spasms. 20 tablet Jasai Sorg, Lurena Joiner, PA-C      PDMP not reviewed this encounter.   Kathrine Haddock 05/11/22 1924

## 2022-05-11 NOTE — ED Triage Notes (Signed)
Onset today patient started with low back pain after doing her make-up. No falls or injuries to report.

## 2022-05-11 NOTE — Discharge Instructions (Addendum)
I recommend ibuprofen every 6 hours for pain.  You can take this in combination with the muscle relaxer.  Please take the muscle relaxer only at nighttime if it makes you drowsy.  Follow-up with your primary care provider if symptoms are persistent

## 2022-07-24 ENCOUNTER — Encounter (HOSPITAL_COMMUNITY): Payer: Self-pay

## 2022-07-24 ENCOUNTER — Ambulatory Visit (HOSPITAL_COMMUNITY)
Admission: EM | Admit: 2022-07-24 | Discharge: 2022-07-24 | Disposition: A | Discharge status: Home / Self Care | Payer: Medicaid Other | Attending: Emergency Medicine | Admitting: Emergency Medicine

## 2022-07-24 DIAGNOSIS — N3000 Acute cystitis without hematuria: Secondary | ICD-10-CM | POA: Insufficient documentation

## 2022-07-24 LAB — POCT URINALYSIS DIPSTICK, ED / UC
Glucose, UA: 100 mg/dL — AB
Ketones, ur: 15 mg/dL — AB
Nitrite: POSITIVE — AB
Protein, ur: 100 mg/dL — AB
Specific Gravity, Urine: 1.015 (ref 1.005–1.030)
Urobilinogen, UA: >=8 mg/dL (ref 0.0–1.0)
pH: 6.5 (ref 5.0–8.0)

## 2022-07-24 MED ORDER — NITROFURANTOIN MONOHYD MACRO 100 MG PO CAPS: 100.0000 mg | ORAL_CAPSULE | Freq: Two times a day (BID) | ORAL | 0 refills | Status: DC

## 2022-07-24 NOTE — Discharge Instructions (Addendum)
Your urinalysis shows Audrey Patterson blood cells and nitrates which are indicative of infection, your urine will be sent to the lab to determine exactly which bacteria is present, if any changes need to be made to your medications you will be notified  Begin use of  Macrobid every morning and every evening for 5 days   You may use over-the-counter Azo to help minimize your symptoms until antibiotic removes bacteria, this medication will turn your urine orange  Increase your fluid intake through use of water  As always practice good hygiene, wiping front to back and avoidance of scented vaginal products to prevent further irritation  If symptoms continue to persist after use of medication or recur please follow-up with urgent care or your primary doctor as needed  

## 2022-07-24 NOTE — ED Triage Notes (Signed)
Burning with urination, dysuria, urgency, decreased output x2 days.

## 2022-07-24 NOTE — ED Provider Notes (Signed)
[facility]    CSN: [id number] Arrival date & time: 07/24/22  1318      History   Chief Complaint Chief Complaint  Patient presents with   Dysuria    HPI Audrey Patterson is a 21 y.o. female.   Patient presents with dysuria, frequency, urgency, lower abdominal pressure and incomplete bladder emptying for 2 days.  Has attempted use of Azo which has been somewhat helpful.  Denies hematuria, vaginal discharge, itching, odor, flank pain, fever or chills.  History reviewed. No pertinent past medical history.  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 No pertinent surgical history.  OB History   No obstetric history on file.      Home Medications    Prior to Admission medications   Medication Sig Start Date End Date Taking? Authorizing Provider  cyclobenzaprine (FLEXERIL) 10 MG tablet Take 1 tablet (10 mg total) by mouth 2 (two) times daily as needed for muscle spasms. 05/11/22   Rising, Lurena Joiner, PA-C  escitalopram (LEXAPRO) 20 MG tablet Take 1 tablet (20 mg total) by mouth daily. 06/16/21 06/16/22  Shanna Cisco, NP  hydrOXYzine (ATARAX/VISTARIL) 25 MG tablet Take 1 tablet (25 mg total) by mouth 3 (three) times daily as needed for anxiety. 06/16/21   Shanna Cisco, NP  polyethylene glycol (MIRALAX / GLYCOLAX) 17 g packet Take 17 g by mouth 2 (two) times daily. 09/20/19   Ellin Mayhew, MD  traZODone (DESYREL) 50 MG tablet Take 0.5 tablets (25 mg total) by mouth at bedtime. 04/07/21   Meta Hatchet, PA    Family History Family History  Problem Relation Age of Onset   Healthy Mother    Healthy Father     Social History Social History   Tobacco Use   Smoking status: Never   Smokeless tobacco:  Never  Substance Use Topics   Alcohol use: Never   Drug use: Never     Allergies   Patient has no known allergies.   Review of Systems Review of Systems  Constitutional: Negative.   Respiratory: Negative.    Cardiovascular: Negative.   Genitourinary:  Positive for dysuria, frequency, pelvic pain and urgency. Negative for decreased urine volume, difficulty urinating, dyspareunia, enuresis, flank pain, genital sores, hematuria, menstrual problem, vaginal bleeding, vaginal discharge and vaginal pain.  Skin: Negative.   Neurological: Negative.      Physical Exam Triage Vital Signs ED Triage Vitals  Enc Vitals Group     BP 07/24/22 1417 110/68     Pulse Rate 07/24/22 1417 81     Resp 07/24/22 1417 16     Temp 07/24/22 1417 98.1 F (36.7 C)     Temp Source 07/24/22 1417 Oral     SpO2 07/24/22 1417 99 %     Weight --      Height --      Head Circumference --      Peak Flow --      Pain Score 07/24/22 1420 7     Pain Loc --      Pain Edu? --      Excl. in GC? --    No data found.  Updated Vital Signs BP 110/68 (BP Location: Left Arm)   Pulse 81  Temp 98.1 F (36.7 C) (Oral)   Resp 16   LMP  (LMP Unknown)   SpO2 99%   Visual Acuity Right Eye Distance:   Left Eye Distance:   Bilateral Distance:    Right Eye Near:   Left Eye Near:    Bilateral Near:     Physical Exam Constitutional:      Appearance: Normal appearance.  Eyes:     Extraocular Movements: Extraocular movements intact.  Pulmonary:     Effort: Pulmonary effort is normal.  Abdominal:     General: Abdomen is flat. Bowel sounds are normal.     Palpations: Abdomen is soft.     Tenderness: There is abdominal tenderness in the suprapubic area. There is no right CVA tenderness, left CVA tenderness or guarding.  Neurological:     Mental Status: She is alert and oriented to person, place, and time. Mental status is at baseline.  Psychiatric:        Mood and Affect: Mood normal.        Behavior:  Behavior normal.      UC Treatments / Results  Labs (all labs ordered are listed, but only abnormal results are displayed) Labs Reviewed  POCT URINALYSIS DIPSTICK, ED / UC    EKG   Radiology No results found.  Procedures Procedures (including critical care time)  Medications Ordered in UC Medications - No data to display  Initial Impression / Assessment and Plan / UC Course  I have reviewed the triage vital signs and the nursing notes.  Pertinent labs & imaging results that were available during my care of the patient were reviewed by me and considered in my medical decision making (see chart for details).  Acute cystitis without hematuria  Urinalysis showing leukocytes and nitrates, sent for culture, discussed findings with patient, Macrobid 5-day course prescribed recommended continued use of Azo, over-the-counter analgesics, increase fluid intake and good hygiene for supportive measures, may follow-up with urgent care as needed if symptoms persist or recur Final Clinical Impressions(s) / UC Diagnoses   Final diagnoses:  None   Discharge Instructions   None    ED Prescriptions   None    PDMP not reviewed this encounter.   Valinda Hoar, NP 07/24/22 1500

## 2022-07-28 LAB — URINE CULTURE: Culture: >=100000 — AB

## 2022-07-30 ENCOUNTER — Telehealth (HOSPITAL_COMMUNITY): Payer: Self-pay | Admitting: Emergency Medicine

## 2022-07-30 MED ORDER — CEPHALEXIN 500 MG PO CAPS: 500.0000 mg | ORAL_CAPSULE | Freq: Two times a day (BID) | ORAL | 0 refills | Status: AC

## 2022-10-22 ENCOUNTER — Encounter: Payer: Self-pay | Admitting: Advanced Practice Midwife

## 2022-10-22 ENCOUNTER — Ambulatory Visit (INDEPENDENT_AMBULATORY_CARE_PROVIDER_SITE_OTHER): Payer: Medicaid Other | Admitting: Advanced Practice Midwife

## 2022-10-22 ENCOUNTER — Other Ambulatory Visit (HOSPITAL_COMMUNITY)
Admission: RE | Admit: 2022-10-22 | Discharge: 2022-10-22 | Disposition: A | Discharge status: Home / Self Care | Payer: Medicaid Other | Source: Ambulatory Visit | Attending: Advanced Practice Midwife | Admitting: Advanced Practice Midwife

## 2022-10-22 VITALS — BP 120/82 | HR 91 | Ht 61.25 in | Wt 141.8 lb

## 2022-10-22 DIAGNOSIS — Z113 Encounter for screening for infections with a predominantly sexual mode of transmission: Secondary | ICD-10-CM

## 2022-10-22 DIAGNOSIS — Z3009 Encounter for other general counseling and advice on contraception: Secondary | ICD-10-CM | POA: Diagnosis not present

## 2022-10-22 DIAGNOSIS — R300 Dysuria: Secondary | ICD-10-CM

## 2022-10-22 DIAGNOSIS — N390 Urinary tract infection, site not specified: Secondary | ICD-10-CM | POA: Diagnosis not present

## 2022-10-22 DIAGNOSIS — Z0000 Encounter for general adult medical examination without abnormal findings: Secondary | ICD-10-CM | POA: Diagnosis not present

## 2022-10-22 DIAGNOSIS — R350 Frequency of micturition: Secondary | ICD-10-CM

## 2022-10-22 LAB — POCT URINALYSIS DIPSTICK
Bilirubin, UA: NEGATIVE
Blood, UA: POSITIVE
Glucose, UA: NEGATIVE
Ketones, UA: NEGATIVE
Nitrite, UA: NEGATIVE
Protein, UA: POSITIVE — AB
Spec Grav, UA: 1.015 (ref 1.010–1.025)
Urobilinogen, UA: 0.2 E.U./dL
pH, UA: 6.5 (ref 5.0–8.0)

## 2022-10-22 MED ORDER — NITROFURANTOIN MONOHYD MACRO 100 MG PO CAPS: 100.0000 mg | ORAL_CAPSULE | ORAL | 2 refills | Status: DC | PRN

## 2022-10-22 MED ORDER — NITROFURANTOIN MONOHYD MACRO 100 MG PO CAPS: 100.0000 mg | ORAL_CAPSULE | Freq: Two times a day (BID) | ORAL | 0 refills | Status: DC

## 2022-10-22 MED ORDER — NORETHIN ACE-ETH ESTRAD-FE 1-20 MG-MCG PO TABS: 1.0000 | ORAL_TABLET | Freq: Every day | ORAL | 15 refills | Status: DC

## 2022-10-22 NOTE — Progress Notes (Signed)
Subjective:     Audrey Patterson is a 21 y.o. female here at [facility] for a routine exam.  Current complaints: dysuria, urgency, and frequency x 2 days, similar to symptoms with previous UTIs. She reports 4 UTIs in the last few months.  Personal health questionnaire reviewed: yes.  Do you have a primary care provider? yes Do you feel safe at home? yes    Health Maintenance Due  Topic Date Due   COVID-19 Vaccine (1) Never done   HPV VACCINES (1 - 2-dose series) Never done   CHLAMYDIA SCREENING  Never done   HIV Screening  Never done   Hepatitis C Screening  Never done   DTaP/Tdap/Td (1 - Tdap) Never done   INFLUENZA VACCINE  Never done     Risk factors for chronic health problems: Smoking: Alchohol/how much: Pt BMI: There is no height or weight on file to calculate BMI.   Gynecologic History No LMP recorded. (Menstrual status: Oral contraceptives). Contraception: OCP (estrogen/progesterone) Last Pap: n/a.  Last mammogram: n/a.   Obstetric History OB History  No obstetric history on file.     The following portions of the patient's history were reviewed and updated as appropriate: allergies, current medications, past family history, past medical history, past social history, past surgical history, and problem list.  Review of Systems Pertinent items noted in HPI and remainder of comprehensive ROS otherwise negative.    Objective:   VS reviewed, nursing note reviewed,  Constitutional: well developed, well nourished, no distress HEENT: normocephalic, thyroid without enlargement or mass HEART: RRR, no murmurs rubs/gallops RESP: clear and equal to auscultation bilaterally in all lobes  Breast Exam:  Deferred with low risks and shared decision making, discussed recommendation to start mammogram between 40-50 yo/  Abdomen: soft Neuro: alert and oriented x 3 Skin: warm, dry Psych: affect normal Pelvic exam: Deferred      Assessment/Plan:   1. Well woman exam  (no gynecological exam)   2. Screen for STD (sexually transmitted disease)  - Cervicovaginal ancillary only( [place]) - Hepatitis B surface antigen - Hepatitis C antibody - HIV Antibody (routine testing w rflx) - RPR  3. Dysuria  - POCT Urinalysis Dipstick - Urine Culture  4. Urinary frequency  - POCT Urinalysis Dipstick - Urine Culture  5. Frequent UTI --Pt reports 4 UTIs in last 6-8 months.   --Discussed options, including postcoital dosing of abx and suppression daily. Pt to start with postcoital single dose. F/U in office in 3 months, if not improved, consider suppression daily dosing. - nitrofurantoin, macrocrystal-monohydrate, (MACROBID) 100 MG capsule; Take 1 capsule (100 mg total) by mouth as needed. Take one tablet as needed immediately after intercourse.  Dispense: 30 capsule; Refill: 2  6. Acute lower UTI (urinary tract infection) --Blood and leukocyte positive on urine dip today in office --Treat with macrobid, which resolved symptoms when treated in ED 2 months ago.   --Urine culture pending  - nitrofurantoin, macrocrystal-monohydrate, (MACROBID) 100 MG capsule; Take 1 capsule (100 mg total) by mouth 2 (two) times daily.  Dispense: 14 capsule; Refill: 0  7. Encounter for counseling regarding contraception --Discussed pt contraceptive plans and reviewed contraceptive methods based on pt preferences and effectiveness.  Pt prefers to continue OCPs with continuous dosing.  Discussed lower effectiveness with missed pills, suggestions to stay on track with pill taking. - norethindrone-ethinyl estradiol-FE (JUNEL FE 1/20) 1-20 MG-MCG tablet; Take 1 tablet by mouth daily. Take all active pills for 21 days, then  skip 7 days of placebo and start next pack for continuous dosing.  Dispense: 28 tablet; Refill: 15     No follow-ups on file.   Sharen Counter, CNM 1:16 PM

## 2022-10-22 NOTE — Progress Notes (Signed)
Patient presents as a New Patient for AEX. Patient desires STD screening. Also complains of urinary frequency, urgency, and dysuria. Sx present for 1 day. Currently taking ocp prescribed by [facility]. No other concerns.

## 2022-10-23 LAB — CERVICOVAGINAL ANCILLARY ONLY
Chlamydia: POSITIVE — AB
Comment: NEGATIVE
Comment: NEGATIVE
Comment: NORMAL
Neisseria Gonorrhea: NEGATIVE
Trichomonas: NEGATIVE

## 2022-10-23 LAB — RPR: RPR Ser Ql: NONREACTIVE

## 2022-10-23 LAB — HEPATITIS C ANTIBODY: Hep C Virus Ab: NONREACTIVE

## 2022-10-23 LAB — HEPATITIS B SURFACE ANTIGEN: Hepatitis B Surface Ag: NEGATIVE

## 2022-10-23 LAB — HIV ANTIBODY (ROUTINE TESTING W REFLEX): HIV Screen 4th Generation wRfx: NONREACTIVE

## 2022-10-24 LAB — URINE CULTURE

## 2022-10-26 ENCOUNTER — Telehealth: Payer: Self-pay

## 2022-10-26 MED ORDER — DOXYCYCLINE MONOHYDRATE 100 MG PO CAPS: 100.0000 mg | ORAL_CAPSULE | Freq: Two times a day (BID) | ORAL | 0 refills | Status: DC

## 2022-10-26 NOTE — Telephone Encounter (Signed)
Attempted to contact about results, no answer, left vm RX sent per protocol, Form faxed to [facility]

## 2023-01-28 ENCOUNTER — Other Ambulatory Visit: Payer: Self-pay | Admitting: Advanced Practice Midwife

## 2023-01-28 DIAGNOSIS — N390 Urinary tract infection, site not specified: Secondary | ICD-10-CM

## 2023-02-01 MED ORDER — NITROFURANTOIN MONOHYD MACRO 100 MG PO CAPS: 100.0000 mg | ORAL_CAPSULE | Freq: Two times a day (BID) | ORAL | 0 refills | Status: DC

## 2023-02-11 NOTE — Progress Notes (Signed)
Erroneous encounter-disregard

## 2023-02-15 ENCOUNTER — Encounter: Payer: Medicaid Other | Admitting: Family

## 2023-02-15 DIAGNOSIS — Z7689 Persons encountering health services in other specified circumstances: Secondary | ICD-10-CM

## 2023-03-17 ENCOUNTER — Emergency Department (HOSPITAL_COMMUNITY): Payer: Medicaid Other

## 2023-03-17 ENCOUNTER — Other Ambulatory Visit: Payer: Self-pay

## 2023-03-17 ENCOUNTER — Emergency Department (HOSPITAL_COMMUNITY)
Admission: EM | Admit: 2023-03-17 | Discharge: 2023-03-17 | Disposition: A | Discharge status: Home / Self Care | Payer: Medicaid Other | Attending: Emergency Medicine | Admitting: Emergency Medicine

## 2023-03-17 DIAGNOSIS — N3000 Acute cystitis without hematuria: Secondary | ICD-10-CM | POA: Insufficient documentation

## 2023-03-17 DIAGNOSIS — R109 Unspecified abdominal pain: Secondary | ICD-10-CM | POA: Diagnosis not present

## 2023-03-17 DIAGNOSIS — R1030 Lower abdominal pain, unspecified: Secondary | ICD-10-CM | POA: Diagnosis present

## 2023-03-17 LAB — COMPREHENSIVE METABOLIC PANEL
ALT: 12 U/L (ref 0–44)
AST: 23 U/L (ref 15–41)
Albumin: 4 g/dL (ref 3.5–5.0)
Alkaline Phosphatase: 35 U/L — ABNORMAL LOW (ref 38–126)
Anion gap: 11 (ref 5–15)
BUN: 11 mg/dL (ref 6–20)
CO2: 20 mmol/L — ABNORMAL LOW (ref 22–32)
Calcium: 9.2 mg/dL (ref 8.9–10.3)
Chloride: 102 mmol/L (ref 98–111)
Creatinine, Ser: 0.62 mg/dL (ref 0.44–1.00)
GFR, Estimated: >60 mL/min (ref >60)
Glucose, Bld: 68 mg/dL — ABNORMAL LOW (ref 70–99)
Potassium: 3.4 mmol/L — ABNORMAL LOW (ref 3.5–5.1)
Sodium: 133 mmol/L — ABNORMAL LOW (ref 135–145)
Total Bilirubin: 0.4 mg/dL (ref 0.3–1.2)
Total Protein: 7.6 g/dL (ref 6.5–8.1)

## 2023-03-17 LAB — URINALYSIS, ROUTINE W REFLEX MICROSCOPIC
Bilirubin Urine: NEGATIVE
Glucose, UA: NEGATIVE mg/dL
Hgb urine dipstick: NEGATIVE
Ketones, ur: 20 mg/dL — AB
Nitrite: NEGATIVE
Protein, ur: NEGATIVE mg/dL
Specific Gravity, Urine: 1.019 (ref 1.005–1.030)
pH: 6 (ref 5.0–8.0)

## 2023-03-17 LAB — CBC
HCT: 37.3 % (ref 36.0–46.0)
Hemoglobin: 12.2 g/dL (ref 12.0–15.0)
MCH: 30.6 pg (ref 26.0–34.0)
MCHC: 32.7 g/dL (ref 30.0–36.0)
MCV: 93.5 fL (ref 80.0–100.0)
Platelets: 251 10*3/uL (ref 150–400)
RBC: 3.99 MIL/uL (ref 3.87–5.11)
RDW: 13.6 % (ref 11.5–15.5)
WBC: 5.4 10*3/uL (ref 4.0–10.5)
nRBC: 0 % (ref 0.0–0.2)

## 2023-03-17 LAB — LIPASE, BLOOD: Lipase: 28 U/L (ref 11–51)

## 2023-03-17 LAB — PREGNANCY, URINE: Preg Test, Ur: NEGATIVE

## 2023-03-17 MED ORDER — IOHEXOL 350 MG/ML SOLN
75.0000 mL | Freq: Once | INTRAVENOUS | Status: AC | PRN
  Administered 2023-03-17: 75 mL via INTRAVENOUS

## 2023-03-17 MED ORDER — CEPHALEXIN 250 MG PO CAPS
500.0000 mg | ORAL_CAPSULE | Freq: Once | ORAL | Status: AC
  Administered 2023-03-17: 500 mg via ORAL
  Filled 2023-03-17: qty 2

## 2023-03-17 MED ORDER — CEPHALEXIN 500 MG PO CAPS: 500.0000 mg | ORAL_CAPSULE | Freq: Two times a day (BID) | ORAL | 0 refills | Status: AC

## 2023-03-17 NOTE — ED Provider Notes (Signed)
[facility]ERGENC[facility] DE[facility]MENT AT [facility] Provider Note   CSN: [id number] Arrival date & time: 03/17/23  1538     History  Chief Complaint  Patient presents with   Abdominal Pain    Audrey Patterson is a 22 y.o. female here for evaluation of diffuse abdominal pain worse lower abdomen over the last week or so.  No dysuria or hematuria.  No pelvic pain or vaginal discharge, no concerns for STDs.  Some nausea without vomiting.  Denies chance of pregnancy, took 3 test at home which was negative.  She does have history of recurrent UTIs.  No flank pain.  Pain not worse with movement, p.o. intake.  No prior abdominal surgeries.  No diarrhea, bloody stool.  No abnormal vaginal bleeding  HPI     Home Medications Prior to Admission medications   Medication Sig Start Date End Date Taking? Authorizing Provider  cephALEXin (KEFLEX) 500 MG capsule Take 1 capsule (500 mg total) by mouth 2 (two) times daily for 7 days. 03/17/23 03/24/23 Yes Ayano Douthitt A, PA-C  cyclobenzaprine (FLEXERIL) 10 MG tablet Take 1 tablet (10 mg total) by mouth 2 (two) times daily as needed for muscle spasms. 05/11/22   Rising, Lurena Joiner, PA-C  escitalopram (LEXAPRO) 20 MG tablet Take 1 tablet (20 mg total) by mouth daily. 06/16/21 06/16/22  Shanna Cisco, NP  polyethylene glycol (MIRALAX / GLYCOLAX) 17 g packet Take 17 g by mouth 2 (two) times daily. 09/20/19   Ellin Mayhew, MD  traZODone (DESYREL) 50 MG tablet Take 0.5 tablets (25 mg total) by mouth at bedtime. 04/07/21   Meta Hatchet, PA      Allergies    Patient has no known allergies.    Review of Systems   Review of Systems  Constitutional: Negative.   HENT: Negative.    Respiratory: Negative.    Cardiovascular: Negative.   Gastrointestinal:  Positive for abdominal pain. Negative for abdominal distention, anal bleeding, blood in stool, constipation, diarrhea, nausea, rectal pain and vomiting.  Genitourinary: Negative.   Musculoskeletal:  Negative.   Skin: Negative.   Neurological: Negative.   All other systems reviewed and are negative.   Physical Exam Updated Vital Signs BP 109/69   Pulse 72   Temp 98.5 F (36.9 C) (Oral)   Resp 18   Wt 63.5 kg   SpO2 100%   BMI 26.24 kg/m  Physical Exam Vitals and nursing note reviewed.  Constitutional:      General: She is not in acute distress.    Appearance: She is well-developed. She is not ill-appearing.  HENT:     Head: Normocephalic and atraumatic.  Eyes:     Pupils: Pupils are equal, round, and reactive to light.  Cardiovascular:     Rate and Rhythm: Normal rate.     Heart sounds: Normal heart sounds.  Pulmonary:     Effort: Pulmonary effort is normal. No respiratory distress.     Breath sounds: Normal breath sounds.  Abdominal:     General: Bowel sounds are normal. There is no distension.     Palpations: Abdomen is soft.     Tenderness: There is abdominal tenderness in the suprapubic area. There is no right CVA tenderness, left CVA tenderness, guarding or rebound. Negative signs include Murphy's sign and McBurney's sign.     Hernia: No hernia is present.  Genitourinary:    Comments: declined Musculoskeletal:        General: Normal range of motion.  Cervical back: Normal range of motion.  Skin:    General: Skin is warm and dry.     Capillary Refill: Capillary refill takes less than 2 seconds.  Neurological:     General: No focal deficit present.     Mental Status: She is alert.  Psychiatric:        Mood and Affect: Mood normal.     ED Results / Procedures / Treatments   Labs (all labs ordered are listed, but only abnormal results are displayed) Labs Reviewed  COMPREHENSIVE METABOLIC PANEL - Abnormal; Notable for the following components:      Result Value   Sodium 133 (*)    Potassium 3.4 (*)    CO2 20 (*)    Glucose, Bld 68 (*)    Alkaline Phosphatase 35 (*)    All other components within normal limits  URINALYSIS, ROUTINE W REFLEX  MICROSCOPIC - Abnormal; Notable for the following components:   APPearance HAZY (*)    Ketones, ur 20 (*)    Leukocytes,Ua MODERATE (*)    Bacteria, UA RARE (*)    All other components within normal limits  LIPASE, BLOOD  CBC  PREGNANCY, URINE    EKG None  Radiology CT ABDOMEN PELVIS W CONTRAST  Result Date: 03/17/2023 CLINICAL DATA:  Acute abdominal pain. EXAM: CT ABDOMEN AND PELVIS WITH CONTRAST TECHNIQUE: Multidetector CT imaging of the abdomen and pelvis was performed using the standard protocol following bolus administration of intravenous contrast. RADIATION DOSE REDUCTION: This exam was performed according to the departmental dose-optimization program which includes automated exposure control, adjustment of the mA and/or kV according to patient size and/or use of iterative reconstruction technique. CONTRAST:  75mL OMNIPAQUE IOHEXOL 350 MG/ML SOLN COMPARISON:  CT with IV contrast 09/19/2019 FINDINGS: Lower chest: No abnormality. Hepatobiliary: In the dome of hepatic segment 8, there is a 9 mm benign-appearing cyst, Hounsfield density of 2.8, only minimally larger than in 2020. There is no mass enhancement in the liver. The gallbladder bile ducts are unremarkable. Pancreas: No abnormality. Spleen: No abnormality.  No splenomegaly. Adrenals/Urinary Tract: There are no adrenal or renal mass enhancement. There is no evidence of urinary stone or obstruction. The bladder is mildly thickened but is not fully distended. Correlate with urinalysis to exclude cystitis. Stomach/Bowel: No dilatation or wall thickening. Mild fecal stasis. The appendix is not seen in this patient. Vascular/Lymphatic: There is mild bilateral pelvic venous congestion, seen previously. No other significant vascular findings. Accessory circumaortic left renal vein. No enlarged lymph nodes. Reproductive: Uterus and bilateral adnexa are unremarkable. The uterus is tilted to the right, as before. Other: As on the prior study there  is a small volume of low-density fluid in the pelvic cul-de-sac, usually physiologic at this age and unchanged. There is no free hemorrhage, free air, acute inflammatory change or abscess. There are no incarcerated hernias. Musculoskeletal: No acute or significant osseous findings. IMPRESSION: 1. Mild bladder thickening which could be due to nondistention or cystitis. 2. Constipation. No bowel obstruction or inflammatory Change. The appendix is not seen. 3. Small volume of low-density fluid in the pelvic cul-de-sac, usually physiologic at this age. 4. Pelvic venous congestion, seen previously. 5. 9 mm benign-appearing cyst in the dome of hepatic segment 8. Electronically Signed   By: Almira Bar M.D.   On: 03/17/2023 20:59    Procedures Procedures    Medications Ordered in ED Medications  iohexol (OMNIPAQUE) 350 MG/ML injection 75 mL (75 mLs Intravenous Contrast Given 03/17/23 2037)  cephALEXin (KEFLEX) capsule 500 mg (500 mg Oral Given 03/17/23 2124)    ED Course/ Medical Decision Making/ A&P    22 year old here for evaluation generalized abdominal pain with associated nausea over the last week or so.  History of frequent UTIs.  She denies any dysuria or hematuria.  Denies pelvic pain, vaginal discharge or concern for STD.  Pain is nonfocal in nature.  No associated loose stool, bloody stool.  Has taken 3 home pregnancy test which have been negative  Here with his significant other, patient states he can be present in room for history, exam and to go over medical results  Labs and imaging personally viewed and interpreted:  CBC without leukocytosis Metabolic panel sodium 133, potassium 3.4 Lipase 28 CBC without leukocytosis UA moderate leuks, rare bacteria, 21-50 WBC Abdomen pelvis with bladder wall thickening, correlate with UA  Discussed results with patient, suspect she likely has UTI.  She denies any concern for STD, PID.  Will DC home with symptomatic management.  First dose  antibiotics here given in ED  Patient is nontoxic, nonseptic appearing, in no apparent distress.  Patient's pain and other symptoms adequately managed in emergency department.  Fluid bolus given.  Labs, imaging and vitals reviewed.  Patient does not meet the SIRS or Sepsis criteria.  On repeat exam patient does not have a surgical abdomin and there are no peritoneal signs.  No indication of appendicitis, bowel obstruction, bowel perforation, cholecystitis, diverticulitis, PID, TOA, torsion or ectopic pregnancy.  Patient discharged home with symptomatic treatment and given strict instructions for follow-up with their primary care physician.  I have also discussed reasons to return immediately to the ER.  Patient expresses understanding and agrees with plan.                              Medical Decision Making Amount and/or Complexity of Data Reviewed Independent Historian: friend External Data Reviewed: labs, radiology and notes. Labs: ordered. Decision-making details documented in ED Course. Radiology: ordered and independent interpretation performed. Decision-making details documented in ED Course.  Risk OTC drugs. Prescription drug management. Parenteral controlled substances. Decision regarding hospitalization. Diagnosis or treatment significantly limited by social determinants of health.          Final Clinical Impression(s) / ED Diagnoses Final diagnoses:  Acute cystitis without hematuria    Rx / DC Orders ED Discharge Orders          Ordered    cephALEXin (KEFLEX) 500 MG capsule  2 times daily        03/17/23 2136              Kainon Varady A, PA-C 03/17/23 2149    Charlynne Pander, MD 03/20/23 2152

## 2023-03-17 NOTE — ED Provider Triage Note (Signed)
Emergency Medicine Provider Triage Evaluation Note  Audrey Patterson , a 22 y.o. female  was evaluated in triage.  Pt complains of generalized abdominal pain for the last week with nausea. No vomiting, diarrhea, or urinary symptoms.  Denies being pregnant.  No vaginal bleeding or discharge..  Review of Systems  Positive: See HPI Negative: See HPI  Physical Exam  BP 139/74 (BP Location: Left Arm)   Pulse 97   Temp 98.8 F (37.1 C)   Resp 16   Wt 63.5 kg   SpO2 97%   BMI 26.24 kg/m  Gen:   Awake, no distress   Resp:  Normal effort  MSK:   Moves extremities without difficulty  Other:  Abdomen soft and nontender, no rebound, guarding, or CVA tenderness  Medical Decision Making  Medically screening exam initiated at 4:51 PM.  Appropriate orders placed.  Audrey Patterson was informed that the remainder of the evaluation will be completed by another provider, this initial triage assessment does not replace that evaluation, and the importance of remaining in the ED until their evaluation is complete.     Tonette Lederer, PA-C 03/17/23 1657

## 2023-03-17 NOTE — ED Triage Notes (Signed)
Pt arrives with c/o ABD pain that started last week. Per pt, she did have a LOC on Sunday and endorses nausea. Per pt, she has taken 3 pregnancy test and all 3 were negative.

## 2023-03-17 NOTE — Discharge Instructions (Signed)
Take the medication as prescribed.  Return for new or worsening symptoms. 

## 2023-03-25 ENCOUNTER — Telehealth: Payer: Self-pay | Admitting: *Deleted

## 2023-03-25 NOTE — Telephone Encounter (Signed)
Pt called regarding which pharmacy Rx was e-scribed to.  RNCM reviewed chart to access After Visit Summary (AVS) and found that Rx was printed.  Advised pt to locate (it was stapled to AVS) and take to pharmacy of choice.

## 2023-03-27 ENCOUNTER — Ambulatory Visit: Payer: Medicaid Other | Admitting: Family

## 2023-04-24 ENCOUNTER — Encounter (HOSPITAL_COMMUNITY): Payer: Self-pay

## 2023-04-24 ENCOUNTER — Ambulatory Visit (HOSPITAL_COMMUNITY)
Admission: EM | Admit: 2023-04-24 | Discharge: 2023-04-24 | Disposition: A | Discharge status: Home / Self Care | Payer: Medicaid Other | Attending: Sports Medicine | Admitting: Sports Medicine

## 2023-04-24 DIAGNOSIS — L03114 Cellulitis of left upper limb: Secondary | ICD-10-CM

## 2023-04-24 MED ORDER — CEPHALEXIN 500 MG PO CAPS: 500.0000 mg | ORAL_CAPSULE | Freq: Four times a day (QID) | ORAL | 0 refills | Status: AC

## 2023-04-24 NOTE — Discharge Instructions (Addendum)
Take the antibiotic 4x per day for 5 days  It was nice to meet you today!

## 2023-04-24 NOTE — ED Triage Notes (Signed)
Pt presents to the office for L-wrist pain and swelling after a insect bite.

## 2023-04-24 NOTE — ED Provider Notes (Signed)
[facility] CARE CENTER   [id number] 04/24/23 Arrival Time: 4098  ASSESSMENT & PLAN:  1. Cellulitis of left upper extremity    -Clinical presentation consistent with cellulitis from insect bite.  No risk factors for MRSA based on history or presentation.  Will treat empirically with Keflex x 5 days.  Encouraged ice to help with swelling.  Tylenol/ibuprofen as needed for pain.  All questions answered and she agrees to plan.  Meds ordered this encounter  Medications   cephALEXin (KEFLEX) 500 MG capsule    Sig: Take 1 capsule (500 mg total) by mouth 4 (four) times daily for 5 days.    Dispense:  20 capsule    Refill:  0     Discharge Instructions      Take the antibiotic 4x per day for 5 days  It was nice to meet you today!        Reviewed expectations re: course of current medical issues. Questions answered. Outlined signs and symptoms indicating need for more acute intervention. Patient verbalized understanding. After Visit Summary given.   SUBJECTIVE: Pleasant 22 year old female comes to clinic to be evaluated for insect bite on left arm.  She thinks she was bit by a mosquito about 2 days ago.  She scratched at it and became red and swollen.  The left forearm has become swollen red and tender.  No drainage.  No spreading of the erythema up the arm.  The erythema started go down a little bit.  She has not taken any medicines for this.  No fevers, chills, shortness of breath.  Patient's last menstrual period was 03/28/2023. History reviewed. No pertinent surgical history.   OBJECTIVE:  Vitals:   04/24/23 0915  BP: 119/61  Pulse: 76  Resp: 18  Temp: 98.1 F (36.7 C)  TempSrc: Oral  SpO2: (!) 79%     Physical Exam Vitals and nursing note reviewed.  Constitutional:      Appearance: Normal appearance. She is not ill-appearing.  HENT:     Head: Normocephalic.  Cardiovascular:     Rate and Rhythm: Normal rate.  Pulmonary:     Effort: Pulmonary effort is  normal.  Musculoskeletal:        General: Normal range of motion.  Skin:    Comments: Left forearm there is about 1 cm of erythema with surrounding soft tissue swelling.  No fluctuant mass.  No discharge.  Neurological:     General: No focal deficit present.     Mental Status: She is alert.  Psychiatric:        Mood and Affect: Mood normal.      Labs: Results for orders placed or performed during the hospital encounter of 03/17/23  Lipase, blood  Result Value Ref Range   Lipase 28 11 - 51 U/L  Comprehensive metabolic panel  Result Value Ref Range   Sodium 133 (L) 135 - 145 mmol/L   Potassium 3.4 (L) 3.5 - 5.1 mmol/L   Chloride 102 98 - 111 mmol/L   CO2 20 (L) 22 - 32 mmol/L   Glucose, Bld 68 (L) 70 - 99 mg/dL   BUN 11 6 - 20 mg/dL   Creatinine, Ser 1.19 0.44 - 1.00 mg/dL   Calcium 9.2 8.9 - 14.7 mg/dL   Total Protein 7.6 6.5 - 8.1 g/dL   Albumin 4.0 3.5 - 5.0 g/dL   AST 23 15 - 41 U/L   ALT 12 0 - 44 U/L   Alkaline Phosphatase 35 (L) 38 - 126  U/L   Total Bilirubin 0.4 0.3 - 1.2 mg/dL   GFR, Estimated >16 >10 mL/min   Anion gap 11 5 - 15  CBC  Result Value Ref Range   WBC 5.4 4.0 - 10.5 K/uL   RBC 3.99 3.87 - 5.11 MIL/uL   Hemoglobin 12.2 12.0 - 15.0 g/dL   HCT 96.0 45.4 - 09.8 %   MCV 93.5 80.0 - 100.0 fL   MCH 30.6 26.0 - 34.0 pg   MCHC 32.7 30.0 - 36.0 g/dL   RDW 11.9 14.7 - 82.9 %   Platelets 251 150 - 400 K/uL   nRBC 0.0 0.0 - 0.2 %  Urinalysis, Routine w reflex microscopic -Urine, Clean Catch  Result Value Ref Range   Color, Urine YELLOW YELLOW   APPearance HAZY (A) CLEAR   Specific Gravity, Urine 1.019 1.005 - 1.030   pH 6.0 5.0 - 8.0   Glucose, UA NEGATIVE NEGATIVE mg/dL   Hgb urine dipstick NEGATIVE NEGATIVE   Bilirubin Urine NEGATIVE NEGATIVE   Ketones, ur 20 (A) NEGATIVE mg/dL   Protein, ur NEGATIVE NEGATIVE mg/dL   Nitrite NEGATIVE NEGATIVE   Leukocytes,Ua MODERATE (A) NEGATIVE   RBC / HPF 0-5 0 - 5 RBC/hpf   WBC, UA 21-50 0 - 5 WBC/hpf    Bacteria, UA RARE (A) NONE SEEN   Squamous Epithelial / HPF 11-20 0 - 5 /HPF   Mucus PRESENT   Pregnancy, urine  Result Value Ref Range   Preg Test, Ur NEGATIVE NEGATIVE   Labs Reviewed - No data to display  Imaging: No results found.   No Known Allergies                                             Past Medical History:  Diagnosis Date   Anxiety    Depression     Social History   Socioeconomic History   Marital status: Single    Spouse name: Not on file   Number of children: Not on file   Years of education: Not on file   Highest education level: Not on file  Occupational History   Not on file  Tobacco Use   Smoking status: Never   Smokeless tobacco: Never  Vaping Use   Vaping Use: Never used  Substance and Sexual Activity   Alcohol use: Yes    Comment: occ   Drug use: Yes    Comment: edibles maybe once per month   Sexual activity: Yes    Partners: Male    Birth control/protection: Pill  Other Topics Concern   Not on file  Social History Narrative   Not on file   Social Determinants of Health   Financial Resource Strain: Medium Risk (06/06/2021)   Overall Financial Resource Strain (CARDIA)    Difficulty of Paying Living Expenses: Somewhat hard  Food Insecurity: No Food Insecurity (06/06/2021)   Hunger Vital Sign    Worried About Running Out of Food in the Last Year: Never true    Ran Out of Food in the Last Year: Never true  Transportation Needs: No Transportation Needs (06/06/2021)   PRAPARE - Administrator, Civil Service (Medical): No    Lack of Transportation (Non-Medical): No  Physical Activity: Inactive (06/06/2021)   Exercise Vital Sign    Days of Exercise per Week: 0 days    Minutes of Exercise  per Session: 0 min  Stress: Stress Concern Present (06/06/2021)   [employer] of Occupational Health - Occupational Stress Questionnaire    Feeling of Stress : Rather much  Social Connections: Socially Isolated (06/06/2021)   Social  Connection and Isolation Panel [NHANES]    Frequency of Communication with Friends and Family: Never    Frequency of Social Gatherings with Friends and Family: Once a week    Attends Religious Services: Never    Database administrator or Organizations: No    Attends Banker Meetings: Never    Marital Status: Never married  Intimate Partner Violence: Not At Risk (06/06/2021)   Humiliation, Afraid, Rape, and Kick questionnaire    Fear of Current or Ex-Partner: No    Emotionally Abused: No    Physically Abused: No    Sexually Abused: No    Family History  Problem Relation Age of Onset   Hypertension Mother    Aneurysm Mother    Healthy Father    Diabetes Maternal Grandfather       Athanasios Heldman, Baldemar Friday, MD 04/24/23 [phone]

## 2023-05-06 ENCOUNTER — Telehealth: Payer: Medicaid Other | Admitting: Physician Assistant

## 2023-05-06 DIAGNOSIS — A084 Viral intestinal infection, unspecified: Secondary | ICD-10-CM | POA: Diagnosis not present

## 2023-05-06 MED ORDER — ONDANSETRON 4 MG PO TBDP: 4.0000 mg | ORAL_TABLET | Freq: Three times a day (TID) | ORAL | 0 refills | Status: DC | PRN

## 2023-05-06 NOTE — Progress Notes (Signed)
E-Visit for Nausea and Vomiting   We are sorry that you are not feeling well. Here is how we plan to help!  Based on what you have shared with me it looks like you have a Virus that is irritating your GI tract.  Vomiting is the forceful emptying of a portion of the stomach's content through the mouth.  Although nausea and vomiting can make you feel miserable, it's important to remember that these are not diseases, but rather symptoms of an underlying illness.  When we treat short term symptoms, we always caution that any symptoms that persist should be fully evaluated in a medical office.  I have prescribed a medication that will help alleviate your symptoms and allow you to stay hydrated:  Zofran 4 mg 1 tablet every 8 hours as needed for nausea and vomiting  HOME CARE: Drink clear liquids.  This is very important! Dehydration (the lack of fluid) can lead to a serious complication.  Start off with 1 tablespoon every 5 minutes for 8 hours. You may begin eating bland foods after 8 hours without vomiting.  Start with saltine crackers, white bread, rice, mashed potatoes, applesauce. After 48 hours on a bland diet, you may resume a normal diet. Try to go to sleep.  Sleep often empties the stomach and relieves the need to vomit.  GET HELP RIGHT AWAY IF:  Your symptoms do not improve or worsen within 2 days after treatment. You have a fever for over 3 days. You cannot keep down fluids after trying the medication.  MAKE SURE YOU:  Understand these instructions. Will watch your condition. Will get help right away if you are not doing well or get worse.    Thank you for choosing an e-visit.  Your e-visit answers were reviewed by a board certified advanced clinical practitioner to complete your personal care plan. Depending upon the condition, your plan could have included both over the counter or prescription medications.  Please review your pharmacy choice. Make sure the pharmacy is open so  you can pick up prescription now. If there is a problem, you may contact your provider through MyChart messaging and have the prescription routed to another pharmacy.  Your safety is important to us. If you have drug allergies check your prescription carefully.   For the next 24 hours you can use MyChart to ask questions about today's visit, request a non-urgent call back, or ask for a work or school excuse. You will get an email in the next two days asking about your experience. I hope that your e-visit has been valuable and will speed your recovery.  I have spent 5 minutes in review of e-visit questionnaire, review and updating patient chart, medical decision making and response to patient.   Shauntee Karp M Lamarco Gudiel, PA-C  

## 2023-05-21 ENCOUNTER — Telehealth: Payer: Medicaid Other | Admitting: Nurse Practitioner

## 2023-05-21 DIAGNOSIS — R3989 Other symptoms and signs involving the genitourinary system: Secondary | ICD-10-CM | POA: Diagnosis not present

## 2023-05-21 MED ORDER — CEPHALEXIN 500 MG PO CAPS: 500.0000 mg | ORAL_CAPSULE | Freq: Two times a day (BID) | ORAL | 0 refills | Status: AC

## 2023-05-21 NOTE — Progress Notes (Signed)

## 2023-06-18 ENCOUNTER — Telehealth: Payer: Medicaid Other | Admitting: Physician Assistant

## 2023-06-18 DIAGNOSIS — M549 Dorsalgia, unspecified: Secondary | ICD-10-CM

## 2023-06-19 NOTE — Progress Notes (Signed)
Because back pain has been present for longer than 4 weeks, I feel your condition warrants further evaluation and I recommend that you be seen in a face to face visit. You need examination and possible imaging to determine cause of pain and proper ongoing management.    NOTE: There will be NO CHARGE for this eVisit   If you are having a true medical emergency please call 911.      For an urgent face to face visit, [place] has eight urgent care centers for your convenience:   NEW!! [facility] Urgent Care Center at [facility] Get Driving Directions [phone] [address] [place], [address]    [facility] Health Urgent Care Center at [facility] Get Driving Directions [phone] [address] [place], [place] [address]   [facility] Health Urgent Care Center [facility]) Get Driving Directions [phone] [address] [place], [place] [address]  [facility] Health Urgent Care Center [facility]) Get Driving Directions [phone] [address] [place],  [place]  [address]  [facility] Health Urgent Care Center [facility] - at [employer]  [phone] [address] [place],  [place] [address]   [facility] Health Urgent Care at [facility] Get Driving Directions [phone] [address] [place], [place] [address]   [facility] Health Urgent Care at [facility] Get Driving Directions  [phone] [address] [place], [place] [address]   [facility] Health Urgent Care at [facility] Directions [phone] [address] [place], [place] [address]  Your MyChart E-visit questionnaire answers were reviewed by a board certified advanced clinical practitioner to complete your personal care plan based on your specific symptoms.  Thank you for using e-Visits.

## 2023-07-15 ENCOUNTER — Telehealth: Payer: Medicaid Other | Admitting: Physician Assistant

## 2023-07-15 DIAGNOSIS — R3989 Other symptoms and signs involving the genitourinary system: Secondary | ICD-10-CM

## 2023-07-15 MED ORDER — NITROFURANTOIN MONOHYD MACRO 100 MG PO CAPS: 100.0000 mg | ORAL_CAPSULE | Freq: Two times a day (BID) | ORAL | 0 refills | Status: DC

## 2023-07-15 NOTE — Progress Notes (Signed)

## 2023-07-15 NOTE — Addendum Note (Signed)
Addended by: Margaretann Loveless on: 07/15/2023 04:51 PM   Modules accepted: Orders

## 2023-08-07 ENCOUNTER — Telehealth: Payer: Medicaid Other | Admitting: Physician Assistant

## 2023-08-07 DIAGNOSIS — N390 Urinary tract infection, site not specified: Secondary | ICD-10-CM

## 2023-08-07 NOTE — Progress Notes (Signed)
Because of frequent UTI and treatment via e-visit this month, you need an exam and urinalysis/culture. As such, I feel your condition warrants further evaluation and I recommend that you be seen in a face to face visit.   NOTE: There will be NO CHARGE for this eVisit   If you are having a true medical emergency please call 911.      For an urgent face to face visit, [facility] has eight urgent care centers for your convenience:   NEW!! [facility] at [facility] Get Driving Directions [phone] [address] [place], [address]    [facility] at [facility] Get Driving Directions [phone] [address] [place], [place] [address]   [facility]) Get Driving Directions [phone] [address] [place], [place] [address]  [facility]) Get Driving Directions [phone] [address] [place],  [place]  [address]  [facility] - at [employer]  [phone] [address] [place],  [place] [address]   [facility] at [facility] Get Driving Directions [phone] [address], [address] [place], [place] [address]   [facility] at [facility] Get Driving Directions  [phone] [address] [place], [place] [address]   [facility] at [facility] Directions [phone] [address], [address] [place], [place] [address]  Your MyChart E-visit questionnaire answers were reviewed by a board certified advanced clinical practitioner to complete your personal care plan based on your specific symptoms.  Thank you for using e-Visits.

## 2023-08-08 ENCOUNTER — Ambulatory Visit
Admission: EM | Admit: 2023-08-08 | Discharge: 2023-08-08 | Disposition: A | Discharge status: Home / Self Care | Payer: Medicaid Other | Attending: Physician Assistant | Admitting: Physician Assistant

## 2023-08-08 DIAGNOSIS — R3989 Other symptoms and signs involving the genitourinary system: Secondary | ICD-10-CM

## 2023-08-08 DIAGNOSIS — N390 Urinary tract infection, site not specified: Secondary | ICD-10-CM

## 2023-08-08 MED ORDER — NITROFURANTOIN MONOHYD MACRO 100 MG PO CAPS: 100.0000 mg | ORAL_CAPSULE | Freq: Two times a day (BID) | ORAL | 0 refills | Status: DC

## 2023-08-08 NOTE — ED Provider Notes (Signed)
[facility]    CSN: [id number] Arrival date & time: 08/08/23  0805      History   Chief Complaint No chief complaint on file.   HPI Audrey Patterson is a 22 y.o. female.   Pt complains of uti symptoms.  Pt reports she has had similar in the past.   The history is provided by the patient. No language interpreter was used.    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 No pertinent surgical history.  OB History     Gravida  0   Para  0   Term  0   Preterm  0   AB  0   Living  0      SAB  0   IAB  0   Ectopic  0   Multiple  0   Live Births  0            Home Medications    Prior to Admission medications   Medication Sig Start Date End Date Taking? Authorizing Provider  nitrofurantoin, macrocrystal-monohydrate, (MACROBID) 100 MG capsule Take 1 capsule (100 mg total) by mouth 2 (two) times daily. 07/15/23   Margaretann Loveless, PA-C    Family History Family History  Problem Relation Age of Onset   Hypertension Mother    Aneurysm Mother    Healthy Father    Diabetes Maternal Grandfather     Social History Social History   Tobacco Use   Smoking status: Never   Smokeless tobacco: Never  Vaping Use   Vaping status: Never Used  Substance Use Topics   Alcohol use: Not Currently    Comment: occ   Drug use: Not Currently    Comment: edibles maybe once per month     Allergies   Patient has no known allergies.   Review of Systems Review of Systems  Genitourinary:  Positive for dysuria. Negative for vaginal discharge.  All other systems reviewed and are  negative.    Physical Exam Triage Vital Signs ED Triage Vitals  Encounter Vitals Group     BP 08/08/23 0843 109/70     Systolic BP Percentile --      Diastolic BP Percentile --      Pulse Rate 08/08/23 0841 91     Resp --      Temp 08/08/23 0841 98.1 F (36.7 C)     Temp Source 08/08/23 0841 Oral     SpO2 08/08/23 0841 99 %     Weight 08/08/23 0844 145 lb (65.8 kg)     Height 08/08/23 0844 5\' 1"  (1.549 m)     Head Circumference --      Peak Flow --      Pain Score 08/08/23 0844 0     Pain Loc --      Pain Education --      Exclude from Growth Chart --    No data found.  Updated Vital Signs BP 109/70 (BP Location: Left Arm)   Pulse 91   Temp 98.1 F (36.7 C) (Oral)   Ht 5\' 1"  (1.549 m)  Wt 65.8 kg   SpO2 99%   BMI 27.40 kg/m   Visual Acuity Right Eye Distance:   Left Eye Distance:   Bilateral Distance:    Right Eye Near:   Left Eye Near:    Bilateral Near:     Physical Exam Vitals and nursing note reviewed.  Constitutional:      Appearance: She is well-developed.  HENT:     Head: Normocephalic.  Cardiovascular:     Rate and Rhythm: Normal rate.  Pulmonary:     Effort: Pulmonary effort is normal.  Abdominal:     General: There is no distension.  Musculoskeletal:        General: Normal range of motion.     Cervical back: Normal range of motion.  Skin:    General: Skin is warm.  Neurological:     General: No focal deficit present.     Mental Status: She is alert and oriented to person, place, and time.      UC Treatments / Results  Labs (all labs ordered are listed, but only abnormal results are displayed) Labs Reviewed - No data to display  EKG   Radiology No results found.  Procedures Procedures (including critical care time)  Medications Ordered in UC Medications - No data to display  Initial Impression / Assessment and Plan / UC Course  I have reviewed the triage vital signs and the nursing notes.  Pertinent labs & imaging  results that were available during my care of the patient were reviewed by me and considered in my medical decision making (see chart for details).     Ua shows nitrate positive  Final Clinical Impressions(s) / UC Diagnoses   Final diagnoses:  Urinary tract infection without hematuria, site unspecified     Discharge Instructions      Return if any problems.     ED Prescriptions   None    PDMP not reviewed this encounter. An After Visit Summary was printed and given to the patient.       Elson Areas, [place] 08/08/23 [phone]

## 2023-08-08 NOTE — ED Triage Notes (Addendum)
Patient presents with UTI symptoms. Pain after urination, urge to urinate x day 2. No treatment used.

## 2023-08-08 NOTE — Discharge Instructions (Signed)
Return if any problems.

## 2023-09-03 ENCOUNTER — Ambulatory Visit
Admission: EM | Admit: 2023-09-03 | Discharge: 2023-09-03 | Disposition: A | Discharge status: Home / Self Care | Payer: Medicaid Other | Attending: Physician Assistant | Admitting: Physician Assistant

## 2023-09-03 ENCOUNTER — Other Ambulatory Visit: Payer: Self-pay

## 2023-09-03 ENCOUNTER — Encounter: Payer: Self-pay | Admitting: Emergency Medicine

## 2023-09-03 DIAGNOSIS — J209 Acute bronchitis, unspecified: Secondary | ICD-10-CM

## 2023-09-03 MED ORDER — PREDNISONE 20 MG PO TABS: 40.0000 mg | ORAL_TABLET | Freq: Every day | ORAL | 0 refills | Status: AC

## 2023-09-03 NOTE — ED Triage Notes (Signed)
Pt here for URI sx x 6 days that have all resolved except for the cough; pt sts painful to cough

## 2023-09-25 NOTE — ED Provider Notes (Signed)
[facility]    CSN: [id number] Arrival date & time: 09/03/23  1440      History   Chief Complaint Chief Complaint  Patient presents with   Cough    HPI Audrey Patterson is a 22 y.o. female.   Patient here today for evaluation of upper respiratory symptoms that began about 6 days ago. She reports congestion and other symptoms have improved with exception of cough. She notes some pain associated with cough. She has not had fever. She has taken OTC meds without resolution.   The history is provided by the patient.  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 No pertinent surgical history.  OB History     Gravida  0   Para  0   Term  0   Preterm  0   AB  0   Living  0      SAB  0   IAB  0   Ectopic  0   Multiple  0   Live Births  0            Home Medications    Prior to Admission medications   Medication Sig Start Date End Date Taking? Authorizing Provider  nitrofurantoin, macrocrystal-monohydrate, (MACROBID) 100 MG capsule Take 1 capsule (100 mg total) by mouth 2 (two) times daily. Patient not taking: Reported on 09/03/2023 08/08/23   Elson Areas, PA-C    Family History Family History  Problem Relation Age of Onset   Hypertension Mother    Aneurysm Mother    Healthy Father    Diabetes Maternal Grandfather     Social History Social History   Tobacco Use   Smoking status: Never   Smokeless tobacco: Never  Vaping Use   Vaping status:  Never Used  Substance Use Topics   Alcohol use: Not Currently    Comment: occ   Drug use: Not Currently    Comment: edibles maybe once per month     Allergies   Patient has no known allergies.   Review of Systems Review of Systems  Constitutional:  Negative for chills and fever.  HENT:  Negative for congestion, ear pain, sinus pressure and sore throat.   Eyes:  Negative for discharge and redness.  Respiratory:  Positive for cough. Negative for shortness of breath and wheezing.   Gastrointestinal:  Negative for abdominal pain, diarrhea, nausea and vomiting.     Physical Exam Triage Vital Signs ED Triage Vitals  Encounter Vitals Group     BP 09/03/23 1630 (!) 139/98     Systolic BP Percentile --      Diastolic BP Percentile --      Pulse Rate 09/03/23 1630 (!) 106     Resp 09/03/23 1630 18     Temp 09/03/23 1630 98.1 F (36.7 C)     Temp Source 09/03/23 1630 Oral     SpO2 09/03/23 1630 98 %     Weight --  Height --      Head Circumference --      Peak Flow --      Pain Score 09/03/23 1631 0     Pain Loc --      Pain Education --      Exclude from Growth Chart --    No data found.  Updated Vital Signs BP (!) 139/98 (BP Location: Left Arm)   Pulse (!) 106   Temp 98.1 F (36.7 C) (Oral)   Resp 18   SpO2 98%   Visual Acuity Right Eye Distance:   Left Eye Distance:   Bilateral Distance:    Right Eye Near:   Left Eye Near:    Bilateral Near:     Physical Exam Vitals and nursing note reviewed.  Constitutional:      General: She is not in acute distress.    Appearance: Normal appearance. She is not ill-appearing.  HENT:     Head: Normocephalic and atraumatic.     Right Ear: Tympanic membrane normal.     Left Ear: Tympanic membrane normal.     Nose: Congestion present.     Mouth/Throat:     Mouth: Mucous membranes are moist.     Pharynx: No oropharyngeal exudate or posterior oropharyngeal erythema.  Eyes:     Conjunctiva/sclera: Conjunctivae  normal.  Cardiovascular:     Rate and Rhythm: Normal rate and regular rhythm.     Heart sounds: Normal heart sounds. No murmur heard. Pulmonary:     Effort: Pulmonary effort is normal. No respiratory distress.     Breath sounds: Normal breath sounds. No wheezing, rhonchi or rales.  Skin:    General: Skin is warm and dry.  Neurological:     Mental Status: She is alert.  Psychiatric:        Mood and Affect: Mood normal.        Thought Content: Thought content normal.      UC Treatments / Results  Labs (all labs ordered are listed, but only abnormal results are displayed) Labs Reviewed - No data to display  EKG   Radiology No results found.  Procedures Procedures (including critical care time)  Medications Ordered in UC Medications - No data to display  Initial Impression / Assessment and Plan / UC Course  I have reviewed the triage vital signs and the nursing notes.  Pertinent labs & imaging results that were available during my care of the patient were reviewed by me and considered in my medical decision making (see chart for details).    Will treat to cover bronchitis given continued cough. Low suspicion for pneumonia given lack of fever, productive cough, etc. Recommended follow up if no gradual improvement or with any further concerns.   Final Clinical Impressions(s) / UC Diagnoses   Final diagnoses:  Acute bronchitis, unspecified organism   Discharge Instructions   None    ED Prescriptions     Medication Sig Dispense Auth. Provider   predniSONE (DELTASONE) 20 MG tablet Take 2 tablets (40 mg total) by mouth daily with breakfast for 5 days. 10 tablet Tomi Bamberger, PA-C      PDMP not reviewed this encounter.   Tomi Bamberger, PA-C 09/25/23 2101

## 2023-11-05 ENCOUNTER — Telehealth: Payer: Medicaid Other | Admitting: Physician Assistant

## 2023-11-05 DIAGNOSIS — R3989 Other symptoms and signs involving the genitourinary system: Secondary | ICD-10-CM | POA: Diagnosis not present

## 2023-11-05 MED ORDER — CEPHALEXIN 500 MG PO CAPS: 500.0000 mg | ORAL_CAPSULE | Freq: Two times a day (BID) | ORAL | 0 refills | Status: AC

## 2023-11-05 NOTE — Progress Notes (Signed)

## 2023-11-05 NOTE — Progress Notes (Signed)
I have spent 5 minutes in review of e-visit questionnaire, review and updating patient chart, medical decision making and response to patient.   Mia Milan Cody Jacklynn Dehaas, PA-C    

## 2023-11-26 ENCOUNTER — Encounter: Payer: Self-pay | Admitting: Advanced Practice Midwife

## 2023-11-27 ENCOUNTER — Other Ambulatory Visit: Payer: Self-pay

## 2023-11-27 DIAGNOSIS — Z3009 Encounter for other general counseling and advice on contraception: Secondary | ICD-10-CM

## 2023-11-27 MED ORDER — NORETHIN ACE-ETH ESTRAD-FE 1-20 MG-MCG PO TABS: 1.0000 | ORAL_TABLET | Freq: Every day | ORAL | 0 refills | Status: DC

## 2023-11-27 NOTE — Progress Notes (Signed)
 Pt requested BC refill. Pt has not had an AEX. Informed pt we could send one refill until she can get that AEX scheduled. Pt given office number and made aware she is not able to get more refills on BC until she has an AEX.

## 2023-12-21 ENCOUNTER — Other Ambulatory Visit: Payer: Self-pay | Admitting: Obstetrics & Gynecology

## 2023-12-21 DIAGNOSIS — Z3009 Encounter for other general counseling and advice on contraception: Secondary | ICD-10-CM

## 2023-12-29 ENCOUNTER — Telehealth: Payer: Medicaid Other | Admitting: Nurse Practitioner

## 2023-12-29 DIAGNOSIS — R1110 Vomiting, unspecified: Secondary | ICD-10-CM

## 2023-12-29 MED ORDER — ONDANSETRON HCL 4 MG PO TABS: 4.0000 mg | ORAL_TABLET | Freq: Three times a day (TID) | ORAL | 0 refills | Status: DC | PRN

## 2023-12-29 NOTE — Progress Notes (Signed)
 I have spent 5 minutes in review of e-visit questionnaire, review and updating patient chart, medical decision making and response to patient.   Claiborne Rigg, NP

## 2023-12-29 NOTE — Progress Notes (Signed)

## 2024-01-07 ENCOUNTER — Encounter: Payer: Self-pay | Admitting: Advanced Practice Midwife

## 2024-01-07 ENCOUNTER — Other Ambulatory Visit (HOSPITAL_COMMUNITY)
Admission: RE | Admit: 2024-01-07 | Discharge: 2024-01-07 | Disposition: A | Discharge status: Home / Self Care | Payer: Medicaid Other | Source: Ambulatory Visit | Attending: Advanced Practice Midwife | Admitting: Advanced Practice Midwife

## 2024-01-07 ENCOUNTER — Ambulatory Visit (INDEPENDENT_AMBULATORY_CARE_PROVIDER_SITE_OTHER): Payer: Medicaid Other | Admitting: Advanced Practice Midwife

## 2024-01-07 ENCOUNTER — Other Ambulatory Visit: Payer: Self-pay | Admitting: Advanced Practice Midwife

## 2024-01-07 VITALS — BP 113/69 | HR 80 | Ht 61.0 in | Wt 139.0 lb

## 2024-01-07 DIAGNOSIS — Z3009 Encounter for other general counseling and advice on contraception: Secondary | ICD-10-CM

## 2024-01-07 DIAGNOSIS — M5489 Other dorsalgia: Secondary | ICD-10-CM

## 2024-01-07 DIAGNOSIS — G8929 Other chronic pain: Secondary | ICD-10-CM

## 2024-01-07 DIAGNOSIS — Z113 Encounter for screening for infections with a predominantly sexual mode of transmission: Secondary | ICD-10-CM | POA: Insufficient documentation

## 2024-01-07 DIAGNOSIS — N62 Hypertrophy of breast: Secondary | ICD-10-CM

## 2024-01-07 DIAGNOSIS — N644 Mastodynia: Secondary | ICD-10-CM | POA: Diagnosis not present

## 2024-01-07 DIAGNOSIS — Z01419 Encounter for gynecological examination (general) (routine) without abnormal findings: Secondary | ICD-10-CM | POA: Diagnosis not present

## 2024-01-07 DIAGNOSIS — M549 Dorsalgia, unspecified: Secondary | ICD-10-CM | POA: Insufficient documentation

## 2024-01-07 DIAGNOSIS — A749 Chlamydial infection, unspecified: Secondary | ICD-10-CM | POA: Diagnosis not present

## 2024-01-07 NOTE — Progress Notes (Signed)
 Pt. Presents for annual. Pt does not want a pap today but does want std testing(swab only)

## 2024-01-07 NOTE — Progress Notes (Signed)
   Subjective:     Audrey Patterson is a 23 y.o. female here at [facility] for a routine exam.  Current complaints: breast pain,  back pain r/t large breasts, desires STI screening and OCP renewal.  Personal and family health history reviewed: yes.  Do you have a primary care provider? yes Do you feel safe at home? yes  Flowsheet Row Office Visit from 10/22/2022 in [facility] for [facility] Total Score 0       Health Maintenance Due  Topic Date Due   HPV VACCINES (1 - 3-dose series) Never done   DTaP/Tdap/Td (1 - Tdap) Never done   INFLUENZA VACCINE  Never done   COVID-19 Vaccine (1 - 2024-25 season) Never done   Cervical Cancer Screening (Pap smear)  Never done   CHLAMYDIA SCREENING  10/23/2023     Risk factors for chronic health problems: Smoking: Alchohol/how much: Pt BMI: Body mass index is 26.26 kg/m.   Gynecologic History No LMP recorded (lmp unknown). (Menstrual status: Oral contraceptives). Contraception: OCP (estrogen/progesterone) Last Pap: n/a.  Last mammogram: n/a.   Obstetric History OB History  Gravida Para Term Preterm AB Living  0 0 0 0 0 0  SAB IAB Ectopic Multiple Live Births  0 0 0 0 0     The following portions of the patient's history were reviewed and updated as appropriate: allergies, current medications, past family history, past medical history, past social history, past surgical history, and problem list.  Review of Systems Pertinent items noted in HPI and remainder of comprehensive ROS otherwise negative.    Objective:  BP 113/69   Pulse 80   Ht 5\' 1"  (1.549 m)   Wt 139 lb (63 kg)   LMP  (LMP Unknown)   BMI 26.26 kg/m   VS reviewed, nursing note reviewed,  Constitutional: well developed, well nourished, no distress HEENT: normocephalic, thyroid without enlargement or mass HEART: RRR, no murmurs rubs/gallops RESP: clear and equal to auscultation bilaterally in all lobes  Breast Exam: exam  performed: right breast with smooth ropelike masses in upper outer quadrant,  no skin or nipple changes or axillary nodes, left breast with smooth ropelike masses in upper outer quadrant, no skin or nipple changes or axillary nodes Abdomen: soft Neuro: alert and oriented x 3 Skin: warm, dry Psych: affect normal Pelvic exam: Deferred per pt      Assessment/Plan:   1. Encounter for annual routine gynecological examination --Pt declines Pap today, wants to return at future visit  2. Breast pain (Primary) --Tenderness in upper outer quadrants, worse when on OCPs, improved when off of them. Associated with breast enlargement while on OCPs. --Fibrocystic breasts on exam, bilateral and symmetrical in location --Pt desires breast reduction  - Ambulatory referral to Plastic Surgery  3. Other chronic back pain  - Ambulatory referral to Plastic Surgery  4. Routine screening for STI (sexually transmitted infection)  - Cervicovaginal ancillary only( [place])  5. Large breasts --Pt with back pain, breast tenderness, desires reduction surgery. --Takes NSAIDs without relief  - Ambulatory referral to Plastic Surgery  6. Encounter for counseling regarding contraception --Discussed pt contraceptive plans and reviewed contraceptive methods based on pt preferences and effectiveness.  Pt prefers OCPs today, but considering IUD at future visit. --Return in 1-2 months for Pap, possible IUD insertion    No follow-ups on file.   Sharen Counter, CNM 6:04 PM

## 2024-01-08 ENCOUNTER — Other Ambulatory Visit: Payer: Self-pay | Admitting: Advanced Practice Midwife

## 2024-01-08 ENCOUNTER — Encounter: Payer: Self-pay | Admitting: Advanced Practice Midwife

## 2024-01-08 DIAGNOSIS — Z3009 Encounter for other general counseling and advice on contraception: Secondary | ICD-10-CM

## 2024-01-08 LAB — CERVICOVAGINAL ANCILLARY ONLY
Chlamydia: POSITIVE — AB
Comment: NEGATIVE
Comment: NEGATIVE
Comment: NORMAL
Neisseria Gonorrhea: NEGATIVE
Trichomonas: NEGATIVE

## 2024-01-08 MED ORDER — NORETHIN ACE-ETH ESTRAD-FE 1-20 MG-MCG PO TABS: 1.0000 | ORAL_TABLET | Freq: Every day | ORAL | 4 refills | Status: AC

## 2024-01-08 MED ORDER — DOXYCYCLINE HYCLATE 100 MG PO CAPS: 100.0000 mg | ORAL_CAPSULE | Freq: Two times a day (BID) | ORAL | 0 refills | Status: DC

## 2024-01-08 NOTE — Addendum Note (Signed)
 Addended by: Hurshel Party on: 01/08/2024 07:27 PM   Modules accepted: Orders

## 2024-01-27 ENCOUNTER — Ambulatory Visit: Payer: Medicaid Other | Admitting: Advanced Practice Midwife

## 2024-02-01 ENCOUNTER — Telehealth: Admitting: Nurse Practitioner

## 2024-02-01 DIAGNOSIS — R399 Unspecified symptoms and signs involving the genitourinary system: Secondary | ICD-10-CM | POA: Diagnosis not present

## 2024-02-01 MED ORDER — NITROFURANTOIN MONOHYD MACRO 100 MG PO CAPS: 100.0000 mg | ORAL_CAPSULE | Freq: Two times a day (BID) | ORAL | 0 refills | Status: DC

## 2024-02-01 NOTE — Progress Notes (Signed)

## 2024-02-01 NOTE — Progress Notes (Signed)
 I have spent 5 minutes in review of e-visit questionnaire, review and updating patient chart, medical decision making and response to patient.   Claiborne Rigg, NP

## 2024-02-05 ENCOUNTER — Ambulatory Visit: Payer: Medicaid Other | Admitting: Plastic Surgery

## 2024-02-05 ENCOUNTER — Encounter: Payer: Self-pay | Admitting: Plastic Surgery

## 2024-02-05 VITALS — BP 120/80 | HR 88 | Ht 63.0 in | Wt 136.6 lb

## 2024-02-05 DIAGNOSIS — M546 Pain in thoracic spine: Secondary | ICD-10-CM | POA: Diagnosis not present

## 2024-02-05 DIAGNOSIS — M542 Cervicalgia: Secondary | ICD-10-CM | POA: Diagnosis not present

## 2024-02-05 DIAGNOSIS — N6489 Other specified disorders of breast: Secondary | ICD-10-CM

## 2024-02-05 DIAGNOSIS — N6481 Ptosis of breast: Secondary | ICD-10-CM | POA: Diagnosis not present

## 2024-02-05 DIAGNOSIS — N62 Hypertrophy of breast: Secondary | ICD-10-CM | POA: Diagnosis not present

## 2024-02-05 NOTE — Progress Notes (Signed)
 Referring Provider Hurshel Party, CNM [address] [place],  [place] [address]   CC:  Chief Complaint  Patient presents with   Advice Only      Audrey Patterson is an 23 y.o. female.  HPI: Ms. Gloeckner is a 23 year old female who presents with complaints of upper back and neck pain which she attributes to the large size of her breast.  She states that she has had this pain since she began breast development several years ago.  Her breast developed out of proportion to her body size and have made it difficult to find bras that fit appropriately.  The bras she does wear because pain in her shoulders and grooving in her shoulders.  She is interested in surgical reduction of the size of her breast.  No Known Allergies  Outpatient Encounter Medications as of 02/05/2024  Medication Sig   doxycycline (VIBRAMYCIN) 100 MG capsule Take 1 capsule (100 mg total) by mouth 2 (two) times daily.   nitrofurantoin, macrocrystal-monohydrate, (MACROBID) 100 MG capsule Take 1 capsule (100 mg total) by mouth 2 (two) times daily for 5 days.   norethindrone-ethinyl estradiol-FE (JUNEL FE 1/20) 1-20 MG-MCG tablet Take 1 tablet by mouth daily.   No facility-administered encounter medications on file as of 02/05/2024.     Past Medical History:  Diagnosis Date   Anxiety    Depression     No past surgical history on file.  Family History  Problem Relation Age of Onset   Hypertension Mother    Aneurysm Mother    Healthy Father    Diabetes Maternal Grandfather     Social History   Social History Narrative   Not on file     Review of Systems General: Denies fevers, chills, weight loss CV: Denies chest pain, shortness of breath, palpitations Breast: Breast are large and she feels that this is contributing to her upper back and neck pain.  No other complaints.  Physical Exam    01/07/2024    4:03 PM 09/03/2023    4:30 PM 08/08/2023    8:44 AM  Vitals with BMI  Height 5\' 1"    5\' 1"   Weight 139 lbs  145 lbs  BMI 26.28  27.41  Systolic 113 139   Diastolic 69 98   Pulse 80 106     General:  No acute distress,  Alert and oriented, Non-Toxic, Normal speech and affect Breast: Patient has large pendulous breast with grade 3 ptosis.  The breasts are clearly out of proportion to her body size.  She has significant breast asymmetry with the right breast larger than the left.  The sternal notch to nipple distance on the right is 32 cm and 31 on the left the fold to nipple distance on the right is 13 cm and 12 cm on the left. Mammogram: Not applicable due to age Assessment/Plan Symptomatic macromastia: Patient has large pendulous breasts and would benefit from a bilateral breast reduction.  I can remove 500 g per breast.  We discussed breast reduction at length.  I showed her the location of the incisions and we discussed the unpredictable nature of scarring and wound healing.  We discussed the risks of bleeding, infection, and seroma formation.  She understands I will use drains postoperatively to help decrease the risk of seroma.  We discussed the risk of nipple loss due to nipple ischemia.  The patient has very laterally placed nipples.  I told her I would attempt to bring  them to a more medial position but this would be difficult and she would still have some lateral positioning of her nipples.  Discussed the postoperative limitations including no heavy lifting greater than 20 pounds, no vigorous activity, no submerging the incisions in water for 6 weeks.  She understands she will need to wear a supportive compressive garment for 6 weeks.  All questions were answered to her satisfaction.  Photographs were obtained today with her consent.  Will submit her for bilateral breast reduction at her request.  Santiago Glad 02/05/2024, 1:40 PM

## 2024-02-14 ENCOUNTER — Encounter: Payer: Self-pay | Admitting: Plastic Surgery

## 2024-03-11 ENCOUNTER — Encounter: Payer: Self-pay | Admitting: Family

## 2024-03-11 ENCOUNTER — Ambulatory Visit (INDEPENDENT_AMBULATORY_CARE_PROVIDER_SITE_OTHER): Payer: Medicaid Other | Admitting: Family

## 2024-03-11 ENCOUNTER — Ambulatory Visit (INDEPENDENT_AMBULATORY_CARE_PROVIDER_SITE_OTHER)

## 2024-03-11 VITALS — BP 109/73 | HR 81 | Temp 99.1°F | Resp 16 | Ht 61.5 in | Wt 134.4 lb

## 2024-03-11 DIAGNOSIS — M419 Scoliosis, unspecified: Secondary | ICD-10-CM | POA: Diagnosis not present

## 2024-03-11 DIAGNOSIS — K589 Irritable bowel syndrome without diarrhea: Secondary | ICD-10-CM | POA: Diagnosis not present

## 2024-03-11 DIAGNOSIS — M549 Dorsalgia, unspecified: Secondary | ICD-10-CM

## 2024-03-11 DIAGNOSIS — Z7689 Persons encountering health services in other specified circumstances: Secondary | ICD-10-CM

## 2024-03-11 DIAGNOSIS — M545 Low back pain, unspecified: Secondary | ICD-10-CM | POA: Diagnosis not present

## 2024-03-11 NOTE — Progress Notes (Signed)
 Patient having back pain when standing for 4 or more hours, diarrhea on and off

## 2024-03-11 NOTE — Progress Notes (Signed)
 Subjective:    Audrey Patterson - 23 y.o. female MRN [id number]  Date of birth: 2001-01-18  HPI  Audrey Patterson is to establish care.   Current issues and/or concerns: - States several years ago she was diagnosed with scoliosis and has not followed-up since then. States upper/lower back pain when she stands for more than 4 hours. Taking over-the-counter medications to help. - States she was diagnosed with IBS several years ago. States she has frequent diarrhea. Denies red flag symptoms.  - No further issues/concerns for discussion today.   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 She has never used smokeless tobacco. She reports that she does not currently use alcohol. She reports that she does not currently use drugs.   Family History  family history includes Aneurysm in her mother; Diabetes in her maternal grandfather; Healthy in her father; Hypertension in her mother.   Medications: reviewed and updated   Objective:   Physical Exam BP 109/73   Pulse 81   Temp 99.1 F (37.3 C) (Oral)   Resp 16   Ht 5' 1.5" (1.562 m)   Wt 134 lb 6.4 oz (61 kg)   LMP  (Exact Date)   SpO2 98%   BMI 24.98 kg/m   Physical Exam HENT:     Head: Normocephalic and atraumatic.     Nose: Nose normal.     Mouth/Throat:     Mouth: Mucous membranes are moist.     Pharynx: Oropharynx is clear.  Eyes:     Extraocular Movements: Extraocular movements intact.     Conjunctiva/sclera: Conjunctivae  normal.     Pupils: Pupils are equal, round, and reactive to light.  Cardiovascular:     Rate and Rhythm: Normal rate and regular rhythm.     Pulses: Normal pulses.     Heart sounds: Normal heart sounds.  Pulmonary:     Effort: Pulmonary effort is normal.     Breath sounds: Normal breath sounds.  Abdominal:     General: Bowel sounds are normal.     Palpations: Abdomen is soft.  Musculoskeletal:        General: Normal range of motion.     Right shoulder: Normal.     Left shoulder: Normal.     Right upper arm: Normal.     Left upper arm: Normal.     Right elbow: Normal.     Left elbow: Normal.     Right forearm: Normal.     Left forearm: Normal.     Right wrist: Normal.     Left wrist: Normal.     Right hand: Normal.     Left hand: Normal.     Cervical back: Normal, normal range of motion and neck supple.     Thoracic back: Normal.     Lumbar back: Normal.     Right hip: Normal.     Left hip: Normal.     Right upper  leg: Normal.     Left upper leg: Normal.     Right knee: Normal.     Left knee: Normal.     Right lower leg: Normal.     Left lower leg: Normal.     Right ankle: Normal.     Left ankle: Normal.     Right foot: Normal.     Left foot: Normal.  Neurological:     General: No focal deficit present.     Mental Status: She is alert and oriented to person, place, and time.  Psychiatric:        Mood and Affect: Mood normal.        Behavior: Behavior normal.        Assessment & Plan:  1. Encounter to establish care (Primary) - Patient presents today to establish care. During the interim follow-up with primary provider as scheduled.  - Return for annual physical examination, labs, and health maintenance. Arrive fasting meaning having no food for at least 8 hours prior to appointment. You may have only water or black coffee. Please take scheduled medications as normal.  2. Scoliosis, unspecified scoliosis type, unspecified spinal region 3. Back pain, unspecified  back location, unspecified back pain laterality, unspecified chronicity - Patient declined pharmacological therapy.  - Diagnostic lumbar spine for evaluation.  - Diagnostic thoracic spine w/ swimmers for evaluation.  - Referral to Orthopedic Surgery for evaluation/management.  - Follow-up with primary provider as scheduled. - DG Lumbar Spine Complete; Future - DG Thoracic Spine W/Swimmers; Future - Ambulatory referral to Orthopedic Surgery - DG Thoracic Spine W/Swimmers - DG Lumbar Spine Complete  4. Irritable bowel syndrome, unspecified type - Referral to Gastroenterology for evaluation/management. - Ambulatory referral to Gastroenterology    Patient was given clear instructions to go to Emergency Department or return to medical center if symptoms don't improve, worsen, or new problems develop.The patient verbalized understanding.  I discussed the assessment and treatment plan with the patient. The patient was provided an opportunity to ask questions and all were answered. The patient agreed with the plan and demonstrated an understanding of the instructions.   The patient was advised to call back or seek an in-person evaluation if the symptoms worsen or if the condition fails to improve as anticipated.    Lavona Pounds, NP 03/11/2024, 4:15 PM Primary Care at [facility]

## 2024-03-12 ENCOUNTER — Encounter: Payer: Self-pay | Admitting: Family

## 2024-03-18 ENCOUNTER — Telehealth: Admitting: Family Medicine

## 2024-03-18 DIAGNOSIS — N390 Urinary tract infection, site not specified: Secondary | ICD-10-CM

## 2024-03-18 NOTE — Progress Notes (Signed)
  Because you have a recurrent UTI in last 60 days you need to be seen in person for this time to get a sample prior to starting medication. Your condition warrants further evaluation and I recommend that you be seen in a face-to-face visit at PCP office or local urgent care.    NOTE: There will be NO CHARGE for this E-Visit   If you are having a true medical emergency, please call 911.

## 2024-03-19 ENCOUNTER — Telehealth: Admitting: Physician Assistant

## 2024-03-19 DIAGNOSIS — M545 Low back pain, unspecified: Secondary | ICD-10-CM

## 2024-03-19 DIAGNOSIS — N390 Urinary tract infection, site not specified: Secondary | ICD-10-CM

## 2024-03-19 NOTE — Progress Notes (Signed)
  Because of having back pain and also having UTI symptoms, I feel your condition warrants further evaluation and I recommend that you be seen in a face-to-face visit.   NOTE: There will be NO CHARGE for this E-Visit   If you are having a true medical emergency, please call 911.     For an urgent face to face visit, [facility] has multiple urgent care centers for your convenience.  Click the link below for the full list of locations and hours, walk-in wait times, appointment scheduling options and driving directions:  Urgent Care - [place], [place], [place], [place], [place], [place]       Your MyChart E-visit questionnaire answers were reviewed by a board certified advanced clinical practitioner to complete your personal care plan based on your specific symptoms.    Thank you for using e-Visits.      I have spent 5 minutes in review of e-visit questionnaire, review and updating patient chart, medical decision making and response to patient.   Angelia Kelp, PA-C

## 2024-03-21 ENCOUNTER — Encounter: Payer: Self-pay | Admitting: Advanced Practice Midwife

## 2024-03-24 ENCOUNTER — Encounter: Payer: Self-pay | Admitting: Emergency Medicine

## 2024-03-24 ENCOUNTER — Ambulatory Visit
Admission: EM | Admit: 2024-03-24 | Discharge: 2024-03-24 | Disposition: A | Discharge status: Home / Self Care | Attending: Family Medicine | Admitting: Family Medicine

## 2024-03-24 DIAGNOSIS — N3000 Acute cystitis without hematuria: Secondary | ICD-10-CM

## 2024-03-24 LAB — POCT URINALYSIS DIP (MANUAL ENTRY)
Bilirubin, UA: NEGATIVE
Glucose, UA: NEGATIVE mg/dL
Nitrite, UA: NEGATIVE
Spec Grav, UA: 1.025 (ref 1.010–1.025)
Urobilinogen, UA: 2 U/dL — AB
pH, UA: 6 (ref 5.0–8.0)

## 2024-03-24 LAB — POCT URINE PREGNANCY: Preg Test, Ur: NEGATIVE

## 2024-03-24 MED ORDER — SULFAMETHOXAZOLE-TRIMETHOPRIM 800-160 MG PO TABS: 1.0000 | ORAL_TABLET | Freq: Two times a day (BID) | ORAL | 0 refills | Status: AC

## 2024-03-24 MED ORDER — PHENAZOPYRIDINE HCL 200 MG PO TABS: 200.0000 mg | ORAL_TABLET | Freq: Three times a day (TID) | ORAL | 0 refills | Status: AC

## 2024-03-24 NOTE — ED Triage Notes (Signed)
 Pt c/o UTI sxs x 3 days.

## 2024-03-24 NOTE — ED Provider Notes (Signed)
 [facility]    CSN: [id number] Arrival date & time: 03/24/24  1117      History   Chief Complaint Chief Complaint  Patient presents with   Urinary Frequency   Abdominal Pain    HPI Audrey Patterson is a 23 y.o. female.    Urinary Frequency Associated symptoms include abdominal pain.  Abdominal Pain  Patient is here for urinary frequency, painful urination and abd pain x 3 days.  No fevers/chills. No n/v.  Mild back pain, but unchanged from normal.  Some vaginal d/c, but normal.  She does have a h/o UTIs.        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 No pertinent surgical history.  OB History     Gravida  0   Para  0   Term  0   Preterm  0   AB  0   Living  0      SAB  0   IAB  0   Ectopic  0   Multiple  0   Live Births  0            Home Medications    Prior to Admission medications   Medication Sig Start Date End Date Taking? Authorizing Provider  norethindrone-ethinyl estradiol-FE (JUNEL FE 1/20) 1-20 MG-MCG tablet Take 1 tablet by mouth daily. 01/08/24   Leftwich-Kirby, Darren Em, CNM    Family History Family History  Problem Relation Age of Onset   Hypertension Mother    Aneurysm Mother    Healthy Father    Diabetes Maternal Grandfather     Social History Social History   Tobacco Use   Smoking status: Never   Smokeless tobacco: Never  Vaping Use   Vaping status: Never Used  Substance Use Topics   Alcohol use: Not Currently    Comment: occ   Drug use: Not Currently    Comment: edibles maybe once per month     Allergies    Patient has no known allergies.   Review of Systems Review of Systems  Constitutional: Negative.   HENT: Negative.    Respiratory: Negative.    Gastrointestinal:  Positive for abdominal pain.  Genitourinary:  Positive for frequency.  Musculoskeletal: Negative.   Psychiatric/Behavioral: Negative.       Physical Exam Triage Vital Signs ED Triage Vitals  Encounter Vitals Group     BP 03/24/24 1205 (!) 150/83     Systolic BP Percentile --      Diastolic BP Percentile --      Pulse Rate 03/24/24 1205 (!) 104     Resp 03/24/24 1205 18     Temp 03/24/24 1205 97.7 F (36.5 C)     Temp Source 03/24/24 1205 Oral     SpO2 03/24/24 1205 99 %     Weight 03/24/24 1204 134 lb 7.7 oz (61 kg)     Height --      Head Circumference --      Peak Flow --      Pain Score 03/24/24 1203 3     Pain  Loc --      Pain Education --      Exclude from Growth Chart --    No data found.  Updated Vital Signs BP (!) 150/83 (BP Location: Right Arm)   Pulse (!) 104   Temp 97.7 F (36.5 C) (Oral)   Resp 18   Wt 61 kg   LMP  (LMP Unknown)   SpO2 99%   BMI 25.00 kg/m   Visual Acuity Right Eye Distance:   Left Eye Distance:   Bilateral Distance:    Right Eye Near:   Left Eye Near:    Bilateral Near:     Physical Exam Constitutional:      General: She is not in acute distress.    Appearance: She is well-developed and normal weight. She is ill-appearing. She is not toxic-appearing.  Cardiovascular:     Rate and Rhythm: Normal rate and regular rhythm.  Pulmonary:     Effort: Pulmonary effort is normal.     Breath sounds: Normal breath sounds.  Abdominal:     General: Abdomen is flat.     Palpations: Abdomen is soft.     Tenderness: There is abdominal tenderness in the suprapubic area.      UC Treatments / Results  Labs (all labs ordered are listed, but only abnormal results are displayed) Labs Reviewed  POCT URINALYSIS DIP (MANUAL ENTRY) - Abnormal; Notable for the following  components:      Result Value   Clarity, UA cloudy (*)    Ketones, POC UA moderate (40) (*)    Blood, UA trace-intact (*)    Protein Ur, POC trace (*)    Urobilinogen, UA 2.0 (*)    Leukocytes, UA Trace (*)    All other components within normal limits  POCT URINE PREGNANCY - Normal  URINE CULTURE   UPT negative  EKG   Radiology No results found.  Procedures Procedures (including critical care time)  Medications Ordered in UC Medications - No data to display  Initial Impression / Assessment and Plan / UC Course  I have reviewed the triage vital signs and the nursing notes.  Pertinent labs & imaging results that were available during my care of the patient were reviewed by me and considered in my medical decision making (see chart for details).   Final Clinical Impressions(s) / UC Diagnoses   Final diagnoses:  Acute cystitis without hematuria     Discharge Instructions      You were seen today for UTI symptoms.  I have sent out an antibiotic to the pharmacy, as well as a medication to help with pain.  I have sent the urine to the lab for culture.  If a change in antibiotic is needed you will be notified.  Please increase fluid intake to help.   ED Prescriptions     Medication Sig Dispense Auth. Provider   sulfamethoxazole -trimethoprim  (BACTRIM  DS) 800-160 MG tablet Take 1 tablet by mouth 2 (two) times daily for 5 days. 10 tablet Legacy Carrender, MD   phenazopyridine (PYRIDIUM) 200 MG tablet Take 1 tablet (200 mg total) by mouth 3 (three) times daily. 6 tablet Lesle Ras, MD      PDMP not reviewed this encounter.   Lesle Ras, MD 03/24/24 1228

## 2024-03-24 NOTE — Discharge Instructions (Signed)
 You were seen today for UTI symptoms.  I have sent out an antibiotic to the pharmacy, as well as a medication to help with pain.  I have sent the urine to the lab for culture.  If a change in antibiotic is needed you will be notified.  Please increase fluid intake to help.

## 2024-03-26 ENCOUNTER — Ambulatory Visit (HOSPITAL_COMMUNITY): Payer: Self-pay

## 2024-03-26 LAB — URINE CULTURE: Culture: 80000 — AB

## 2024-04-10 ENCOUNTER — Encounter: Admitting: Family

## 2024-04-14 ENCOUNTER — Ambulatory Visit
Admission: EM | Admit: 2024-04-14 | Discharge: 2024-04-14 | Disposition: A | Discharge status: Home / Self Care | Attending: Internal Medicine | Admitting: Internal Medicine

## 2024-04-14 DIAGNOSIS — M545 Low back pain, unspecified: Secondary | ICD-10-CM | POA: Diagnosis not present

## 2024-04-14 MED ORDER — IBUPROFEN 800 MG PO TABS
800.0000 mg | ORAL_TABLET | Freq: Once | ORAL | Status: AC
  Administered 2024-04-14: 800 mg via ORAL
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
## 2024-04-29 ENCOUNTER — Encounter: Payer: Self-pay | Admitting: Family

## 2024-05-08 ENCOUNTER — Encounter: Payer: Self-pay | Admitting: Gastroenterology

## 2024-05-11 ENCOUNTER — Encounter: Admitting: Family

## 2024-05-11 ENCOUNTER — Telehealth: Payer: Self-pay | Admitting: Family

## 2024-05-11 NOTE — Progress Notes (Signed)
 Erroneous encounter-disregard

## 2024-05-11 NOTE — Telephone Encounter (Signed)
 Please disregard last encounter. Message was routed in error

## 2024-05-11 NOTE — Telephone Encounter (Signed)
 Called pt and left vm to call office back to reschedule missed physical appt

## 2024-07-01 ENCOUNTER — Ambulatory Visit: Admitting: Gastroenterology

## 2024-07-01 NOTE — Progress Notes (Deleted)
 Teren U Larue [id number] [birth year]   Chief Complaint:  Referring Provider: Lorren Greig PARAS, NP Primary GI MD: Sampson  HPI: Audrey Patterson is a 23 y.o. female with past medical history of anxiety/depression who presents today for a complaint of IBS.    Patient seen by PCP to establish care 03/11/2024.  Reported history of IBS diagnosis several years ago, with frequent diarrhea.  Denied red flag symptoms.  Referred to GI for further evaluation.   Previous GI Procedures/Imaging      Past Medical History:  Diagnosis Date   Anxiety    Depression     No past surgical history on file.  Current Outpatient Medications  Medication Sig Dispense Refill   norethindrone-ethinyl estradiol-FE (JUNEL FE 1/20) 1-20 MG-MCG tablet Take 1 tablet by mouth daily. 84 tablet 4   phenazopyridine  (PYRIDIUM ) 200 MG tablet Take 1 tablet (200 mg total) by mouth 3 (three) times daily. 6 tablet 0   No current facility-administered medications for this visit.    Allergies as of 07/01/2024   (No Known Allergies)    Family History  Problem Relation Age of Onset   Hypertension Mother    Aneurysm Mother    Healthy Father    Diabetes Maternal Grandfather     Social History   Tobacco Use   Smoking status: Never   Smokeless tobacco: Never  Vaping Use   Vaping status: Never Used  Substance Use Topics   Alcohol use: Not Currently    Comment: occ   Drug use: Not Currently    Comment: edibles maybe once per month     Review of Systems:    Constitutional: No weight loss, fever, chills, weakness or fatigue Eyes: No change in vision Ears, Nose, Throat:  No change in hearing or congestion Skin: No rash or itching Cardiovascular: No chest pain, chest pressure or palpitations   Respiratory: No SOB or cough Gastrointestinal: See HPI and otherwise negative Genitourinary: No dysuria or change in urinary frequency Neurological: No headache, dizziness or syncope Musculoskeletal: No new  muscle or joint pain Hematologic: No bleeding or bruising    Physical Exam:  Vital signs: There were no vitals taken for this visit.  Constitutional: NAD, Well developed, Well nourished, alert and cooperative Head:  Normocephalic and atraumatic.  Eyes: No scleral icterus. Conjunctiva pink. Mouth: No oral lesions. Respiratory: Respirations even and unlabored. Lungs clear to auscultation bilaterally.  No wheezes, crackles, or rhonchi.  Cardiovascular:  Regular rate and rhythm. No murmurs. No peripheral edema. Gastrointestinal:  Soft, nondistended, nontender. No rebound or guarding. Normal bowel sounds. No appreciable masses or hepatomegaly. Rectal:  Not performed.  Neurologic:  Alert and oriented x4;  grossly normal neurologically.  Skin:   Dry and intact without significant lesions or rashes. Psychiatric: Oriented to person, place and time. Demonstrates good judgement and reason without abnormal affect or behaviors.   RELEVANT LABS AND IMAGING: CBC    Component Value Date/Time   WBC 5.4 03/17/2023 1636   RBC 3.99 03/17/2023 1636   HGB 12.2 03/17/2023 1636   HCT 37.3 03/17/2023 1636   PLT 251 03/17/2023 1636   MCV 93.5 03/17/2023 1636   MCH 30.6 03/17/2023 1636   MCHC 32.7 03/17/2023 1636   RDW 13.6 03/17/2023 1636   LYMPHSABS 1.3 09/19/2019 1655   MONOABS 0.5 09/19/2019 1655   EOSABS 0.0 09/19/2019 1655   BASOSABS 0.0 09/19/2019 1655    CMP     Component Value Date/Time   NA 133 (L) 03/17/2023  1636   K 3.4 (L) 03/17/2023 1636   CL 102 03/17/2023 1636   CO2 20 (L) 03/17/2023 1636   GLUCOSE 68 (L) 03/17/2023 1636   BUN 11 03/17/2023 1636   CREATININE 0.62 03/17/2023 1636   CALCIUM 9.2 03/17/2023 1636   PROT 7.6 03/17/2023 1636   ALBUMIN 4.0 03/17/2023 1636   AST 23 03/17/2023 1636   ALT 12 03/17/2023 1636   ALKPHOS 35 (L) 03/17/2023 1636   BILITOT 0.4 03/17/2023 1636   GFRNONAA >60 03/17/2023 1636   GFRAA NOT CALCULATED 09/19/2019 1655     Assessment/Plan:        Camie Furbish, PA-C [facility] 07/01/2024, 1:00 PM  Patient Care Team: Lorren Greig PARAS, NP as PCP - General (Nurse Practitioner)

## 2024-08-23 NOTE — Progress Notes (Deleted)
 Audrey Patterson [id number] 2001/06/03   Chief Complaint:  Referring Provider: Lorren Greig PARAS, NP Primary GI MD: Sampson  HPI: Audrey Patterson is a 23 y.o. female with past medical history of scoliosis, anxiety/depression who presents today for a complaint of IBS.    Seen by PCP 03/11/2024 to establish care.  At that time endorsed previous diagnosis of IBS, with frequent diarrhea and no alarm features.  Referred to GI for further evaluation and management.   Previous GI Procedures/Imaging      Past Medical History:  Diagnosis Date   Anxiety    Depression     No past surgical history on file.  Current Outpatient Medications  Medication Sig Dispense Refill   norethindrone-ethinyl estradiol-FE (JUNEL FE 1/20) 1-20 MG-MCG tablet Take 1 tablet by mouth daily. 84 tablet 4   phenazopyridine  (PYRIDIUM ) 200 MG tablet Take 1 tablet (200 mg total) by mouth 3 (three) times daily. 6 tablet 0   No current facility-administered medications for this visit.    Allergies as of 08/24/2024   (No Known Allergies)    Family History  Problem Relation Age of Onset   Hypertension Mother    Aneurysm Mother    Healthy Father    Diabetes Maternal Grandfather     Social History   Tobacco Use   Smoking status: Never   Smokeless tobacco: Never  Vaping Use   Vaping status: Never Used  Substance Use Topics   Alcohol use: Not Currently    Comment: occ   Drug use: Not Currently    Comment: edibles maybe once per month     Review of Systems:    Constitutional: No weight loss, fever, chills, weakness or fatigue Eyes: No change in vision Ears, Nose, Throat:  No change in hearing or congestion Skin: No rash or itching Cardiovascular: No chest pain, chest pressure or palpitations   Respiratory: No SOB or cough Gastrointestinal: See HPI and otherwise negative Genitourinary: No dysuria or change in urinary frequency Neurological: No headache, dizziness or syncope Musculoskeletal:  No new muscle or joint pain Hematologic: No bleeding or bruising    Physical Exam:  Vital signs: There were no vitals taken for this visit.  Constitutional: NAD, Well developed, Well nourished, alert and cooperative Head:  Normocephalic and atraumatic.  Eyes: No scleral icterus. Conjunctiva pink. Mouth: No oral lesions. Respiratory: Respirations even and unlabored. Lungs clear to auscultation bilaterally.  No wheezes, crackles, or rhonchi.  Cardiovascular:  Regular rate and rhythm. No murmurs. No peripheral edema. Gastrointestinal:  Soft, nondistended, nontender. No rebound or guarding. Normal bowel sounds. No appreciable masses or hepatomegaly. Rectal:  Not performed.  Neurologic:  Alert and oriented x4;  grossly normal neurologically.  Skin:   Dry and intact without significant lesions or rashes. Psychiatric: Oriented to person, place and time. Demonstrates good judgement and reason without abnormal affect or behaviors.   RELEVANT LABS AND IMAGING: CBC    Component Value Date/Time   WBC 5.4 03/17/2023 1636   RBC 3.99 03/17/2023 1636   HGB 12.2 03/17/2023 1636   HCT 37.3 03/17/2023 1636   PLT 251 03/17/2023 1636   MCV 93.5 03/17/2023 1636   MCH 30.6 03/17/2023 1636   MCHC 32.7 03/17/2023 1636   RDW 13.6 03/17/2023 1636   LYMPHSABS 1.3 09/19/2019 1655   MONOABS 0.5 09/19/2019 1655   EOSABS 0.0 09/19/2019 1655   BASOSABS 0.0 09/19/2019 1655    CMP     Component Value Date/Time   NA 133 (L)  03/17/2023 1636   K 3.4 (L) 03/17/2023 1636   CL 102 03/17/2023 1636   CO2 20 (L) 03/17/2023 1636   GLUCOSE 68 (L) 03/17/2023 1636   BUN 11 03/17/2023 1636   CREATININE 0.62 03/17/2023 1636   CALCIUM 9.2 03/17/2023 1636   PROT 7.6 03/17/2023 1636   ALBUMIN 4.0 03/17/2023 1636   AST 23 03/17/2023 1636   ALT 12 03/17/2023 1636   ALKPHOS 35 (L) 03/17/2023 1636   BILITOT 0.4 03/17/2023 1636   GFRNONAA >60 03/17/2023 1636   GFRAA NOT CALCULATED 09/19/2019 1655      Assessment/Plan:       Camie Furbish, PA-C [facility] 08/23/2024, 6:38 PM  Patient Care Team: Lorren Greig PARAS, NP as PCP - General (Nurse Practitioner)

## 2024-08-24 ENCOUNTER — Ambulatory Visit: Admitting: Gastroenterology

## 2024-09-28 ENCOUNTER — Ambulatory Visit: Payer: Self-pay

## 2024-09-28 ENCOUNTER — Telehealth: Payer: Self-pay | Admitting: Emergency Medicine

## 2024-09-28 NOTE — Telephone Encounter (Signed)
 Sent my chart message to pt regarding uc appointment for anxiety

## 2024-09-28 NOTE — Telephone Encounter (Signed)
 Noted . Patient is scheduled for UC today. Routing to office to schedule patient for office for follow-up

## 2024-09-28 NOTE — Telephone Encounter (Signed)
 Pt scheduled for f/u with pcp

## 2024-09-28 NOTE — Telephone Encounter (Signed)
 FYI Only or Action Required?: Action required by provider: request for appointment.  Patient was last seen in primary care on 03/11/2024 by Jaycee Greig PARAS, NP.  Called Nurse Triage reporting Anxiety.  Symptoms began yesterday.  Interventions attempted: Nothing.  Symptoms are: gradually worsening.  Triage Disposition: See Physician Within 24 Hours  Patient/caregiver understands and will follow disposition?: Yes     Copied from CRM #[id number]. Topic: Clinical - Red Word Triage >> Sep 28, 2024 10:16 AM Rosina BIRCH wrote: Red Word that prompted transfer to Nurse Triage: anxiety getting worse Reason for Disposition  Patient sounds very upset or troubled to the triager    Scheduled pt urgent care visit for today  Answer Assessment - Initial Assessment Questions 1. CONCERN: Did anything happen that prompted you to call today?      A series of events - increased workload 2. ANXIETY SYMPTOMS: Can you describe how you (your loved one; patient) have been feeling? (e.g., tense, restless, panicky, anxious, keyed up, overwhelmed, sense of impending doom).      Difficulty breathing & speaking,anxious, overwhelmed, hard to calm down, tightness in chest,  3. ONSET: How long have you been feeling this way? (e.g., hours, days, weeks)     X worsening of months and panic attack last night 4. SEVERITY: How would you rate the level of anxiety? (e.g., 0 - 10; or mild, moderate, severe).     severe 5. FUNCTIONAL IMPAIRMENT: How have these feelings affected your ability to do daily activities? Have you had more difficulty than usual doing your normal daily activities? (e.g., getting better, same, worse; self-care, school, work, interactions)     Not able to function 6. HISTORY: Have you felt this way before? Have you ever been diagnosed with an anxiety problem in the past? (e.g., generalized anxiety disorder, panic attacks, PTSD). If Yes, ask: How was this problem treated? (e.g., medicines,  counseling, etc.)  Anxiety and depression 7. RISK OF HARM - SUICIDAL IDEATION: Do you ever have thoughts of hurting or killing yourself? If Yes, ask:  Do you have these feelings now? Do you have a plan on how you would do this?     no 8. TREATMENT:  What has been done so far to treat this anxiety? (e.g., medicines, relaxation strategies). What has helped?     yes 9. THERAPIST: Do you have a counselor or therapist? If Yes, ask: What is their name?     no 10. POTENTIAL TRIGGERS: Do you drink caffeinated beverages (e.g., coffee, colas, teas), and how much daily? Do you drink alcohol or use any drugs? Have you started any new medicines recently?       N/a 11. PATIENT SUPPORT: Who is with you now? Who do you live with? Do you have family or friends who you can talk to?        yes 12. OTHER SYMPTOMS: Do you have any other symptoms? (e.g., feeling depressed, trouble concentrating, trouble sleeping, trouble breathing, palpitations or fast heartbeat, chest pain, sweating, nausea, or diarrhea)       Trouble sleeping and concentration, chest  13. PREGNANCY: Is there any chance you are pregnant? When was your last menstrual period?       Na  Pt called stating her anxiety has increased over the last several months and last night pt had an anxiety attack at work and was sent home.  Pt would like to schedule an appt to come in and be evaluated: nurse scheduled pt with urgent care today  due to no openings however pt still would like to be seen by PCP prior to December which is PCP next openings for office visits - please call patient.  Protocols used: Anxiety and Panic Attack-A-AH

## 2024-09-29 ENCOUNTER — Ambulatory Visit (HOSPITAL_COMMUNITY)
Admission: EM | Admit: 2024-09-29 | Discharge: 2024-09-29 | Disposition: A | Discharge status: Home / Self Care | Attending: Psychiatry | Admitting: Psychiatry

## 2024-09-29 DIAGNOSIS — Z9151 Personal history of suicidal behavior: Secondary | ICD-10-CM | POA: Insufficient documentation

## 2024-09-29 DIAGNOSIS — F419 Anxiety disorder, unspecified: Secondary | ICD-10-CM | POA: Insufficient documentation

## 2024-09-29 DIAGNOSIS — F4322 Adjustment disorder with anxiety: Secondary | ICD-10-CM | POA: Insufficient documentation

## 2024-09-29 DIAGNOSIS — Z566 Other physical and mental strain related to work: Secondary | ICD-10-CM | POA: Insufficient documentation

## 2024-09-29 NOTE — ED Provider Notes (Signed)
 Behavioral Health Urgent Care Medical Screening Exam  Patient Name: Audrey Patterson MRN: [id number] Date of Evaluation: 09/29/24 Chief Complaint:   Diagnosis:  Final diagnoses:  Work-related stress  Anxiety  Adjustment disorder with anxiety    History of Present illness: Audrey Patterson 23 y.o., female patient presented to [facility] as a voluntary walk in unaccompanied with complaints of I want an accommodation letter. Patient reports that her anxiety has worsened due to multiple stressors, including moving in with her mother (who has a diagnosis of bipolar disorder), her own history of anxiety, the loss of her grandfather, and her father losing his job. She states that her job at [employer] is very stressful, particularly when packages overflow on the belt, setting off the programmed alarm, which triggers significant anxiety and near-panic episodes. She reports she manages these episodes by stepping away from the belt; however, her time is docked for breaks over 15 minutes, which she feels is insufficient to calm herself.  Patient reports that she needs a detailed letter to present to HR requesting reassignment to a different position. She reports she wishes to remain employed at [employer] because of the excellent medical and dental benefits and tuition assistance for education.  Angelo U Caicedo, is seen face to face by this provider, consulted with Dr. Cole; and chart reviewed on 09/29/24.  On evaluation Audrey Patterson denies suicidal or homicidal ideation. She reports her last suicidal thoughts occurred in 2023, though she endorses some sadness related to her current living situation. She has a history of anxiety and depression diagnosed in 2022. Since her discharge three years ago, she has not taken any medications. She reports not having a therapist or psychiatrist. She currently lives with her mother and sister and denies access to weapons or firearms. She completed high school and works at  [employer]. She denies any substance use. The patient was advised that a supporting letter for workplace accommodation would be best written by a provider with whom she has an established relationship, such as her primary care provider, therapist, or psychiatrist, as they have a comprehensive history of her care. The patient became tearful and stated she had spoken to her PCP, but the next available appointment is in December. She was encouraged to explore the resources provided and to engage with a therapist or psychiatrist to learn coping strategies and manage her anxiety. The patient was also advised about [facility] outpatient services.  During evaluation Audrey Patterson is sitting in an upright position in no acute distress.  She is alert & oriented x 4, calm, cooperative and attentive for this assessment. Her mood is euthymic with congruent affect, she became tearful upon learning that we cannot provide an accommodation letter. She has normal speech, and behavior.  Objectively there is no evidence of psychosis/mania or delusional thinking. Pt does not appear to be responding to internal or external stimuli.  Patient is able to converse coherently, goal directed thoughts, no distractibility, or pre-occupation.  She also denies suicidal/self-harm/homicidal ideation, psychosis, and paranoia.  Patient answered question appropriately.     Flowsheet Row ED from 09/29/2024 in [facility] UC from 04/14/2024 in [facility] at [facility] UC from 03/24/2024 in [facility] at [facility])  C-SSRS RISK CATEGORY Moderate Risk No Risk No Risk    Psychiatric Specialty Exam  Presentation  General Appearance:Appropriate for Environment  Eye Contact:Good  Speech:Clear and Coherent; Normal Rate  Speech Volume:Normal  Handedness:Right  Mood and Affect  Mood: Euthymic  Affect: Appropriate   Thought Process  Thought  Processes: Coherent  Descriptions of Associations:Intact  Orientation:Full (Time, Place and Person)  Thought Content:WDL  Diagnosis of Schizophrenia or Schizoaffective disorder in past: No data recorded  Hallucinations:None  Ideas of Reference:None  Suicidal Thoughts:No  Homicidal Thoughts:No   Sensorium  Memory: Immediate Good  Judgment: Fair  Insight: Good   Executive Functions  Concentration: Good  Attention Span: Good  Recall: Good  Fund of Knowledge: Good  Language: Good   Psychomotor Activity  Psychomotor Activity: Normal   Assets  Assets: Communication Skills; Vocational/Educational   Sleep  Sleep: Fair  Number of hours: No data recorded  Physical Exam: Physical Exam Vitals reviewed.  Constitutional:      Appearance: Normal appearance.  HENT:     Head: Normocephalic and atraumatic.     Nose: Nose normal.     Mouth/Throat:     Pharynx: Oropharynx is clear.  Cardiovascular:     Rate and Rhythm: Normal rate.  Pulmonary:     Effort: Pulmonary effort is normal.  Musculoskeletal:        General: Normal range of motion.     Cervical back: Normal range of motion.  Neurological:     Mental Status: She is alert and oriented to person, place, and time.  Psychiatric:        Mood and Affect: Mood normal.        Behavior: Behavior normal.        Thought Content: Thought content normal.        Judgment: Judgment normal.    Review of Systems  All other systems reviewed and are negative.  Blood pressure 111/85, pulse 88, temperature 98.3 F (36.8 C), temperature source Oral, resp. rate 17, SpO2 100%. There is no height or weight on file to calculate BMI.  Musculoskeletal: Strength & Muscle Tone: within normal limits Gait & Station: normal Patient leans: N/A   [facility] MSE Discharge Disposition for Follow up and Recommendations: Based on my evaluation the patient does not appear to have an emergency medical condition and can be  discharged with resources and follow up care in outpatient services for Medication Management and Individual Therapy  You can explore these providers to determine the possibility of obtaining a letter for your workplace accommodation request.  Open access clinic, 2nd floor, arrive no later 650 am and sign clinic. Space is limited-1st come/1st serve       Get help right away if: You have thoughts about hurting yourself or others. Get help right away if you feel like you may hurt yourself or others, or have thoughts about taking your own life. Go to your nearest emergency room or: Call 911. Call the National Suicide Prevention Lifeline at 819-062-2033 or 988 in the [place] This is open 24 hours a day. If you're a Veteran: Call 988 and press 1. This is open 24 hours a day. Text the [employer] at 908-664-5234. Summary Mental health is not just the absence of mental illness. It involves understanding your emotions and behaviors, and taking steps to manage them in a healthy way. If you have symptoms of mental or emotional distress, get help from family, friends, a health care provider, or a mental health professional. Practice good mental health behaviors such as stress management skills, self-calming skills, exercise, healthy sleeping and eating, and supportive relationships. This information is not intended to replace advice given to you by your health care provider.  Make sure you discuss any questions you have with your health care provider.  Education provided on the fact that if experiencing worsening of psychiatry symptoms including suicidal ideations, homicidal ideations, or having auditory/visual hallucinations, etc, to call 911, 988, come back to this location, or go to the nearest ER. Pt verbalized understanding.  Tosin Tayler Heiden, NP 09/29/2024, 5:11 PM

## 2024-09-29 NOTE — ED Notes (Signed)
 Patient Is discharging at this time. Printed AVS reviewed with patient along with resources. Patient denies SI, HI, and A/V/H. Valuables/belongings returned to patient. No s/s of current distress.

## 2024-09-29 NOTE — Discharge Instructions (Addendum)
 You can explore these providers to determine the possibility of obtaining a letter for your workplace accommodation request.  Open access clinic, 2nd floor, arrive no later 650 am and sign clinic. Space is limited-1st come/1st serve       Get help right away if: You have thoughts about hurting yourself or others. Get help right away if you feel like you may hurt yourself or others, or have thoughts about taking your own life. Go to your nearest emergency room or: Call 911. Call the National Suicide Prevention Lifeline at 704-487-2413 or 988 in the [place] This is open 24 hours a day. If you're a Veteran: Call 988 and press 1. This is open 24 hours a day. Text the [employer] at 303-331-2487. Summary Mental health is not just the absence of mental illness. It involves understanding your emotions and behaviors, and taking steps to manage them in a healthy way. If you have symptoms of mental or emotional distress, get help from family, friends, a health care provider, or a mental health professional. Practice good mental health behaviors such as stress management skills, self-calming skills, exercise, healthy sleeping and eating, and supportive relationships. This information is not intended to replace advice given to you by your health care provider. Make sure you discuss any questions you have with your health care provider.  Education provided on the fact that if experiencing worsening of psychiatry symptoms including suicidal ideations, homicidal ideations, or having auditory/visual hallucinations, etc, to call 911, 988, come back to this location, or go to the nearest ER. Pt verbalized understanding.
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
## 2024-09-30 ENCOUNTER — Ambulatory Visit (INDEPENDENT_AMBULATORY_CARE_PROVIDER_SITE_OTHER): Admitting: Clinical

## 2024-09-30 ENCOUNTER — Encounter (HOSPITAL_COMMUNITY): Payer: Self-pay | Admitting: Clinical

## 2024-09-30 DIAGNOSIS — F331 Major depressive disorder, recurrent, moderate: Secondary | ICD-10-CM

## 2024-10-06 ENCOUNTER — Ambulatory Visit (HOSPITAL_COMMUNITY): Admitting: Psychiatry

## 2024-10-06 ENCOUNTER — Encounter (HOSPITAL_COMMUNITY): Payer: Self-pay | Admitting: Psychiatry

## 2024-10-06 DIAGNOSIS — G4700 Insomnia, unspecified: Secondary | ICD-10-CM

## 2024-10-06 DIAGNOSIS — F411 Generalized anxiety disorder: Secondary | ICD-10-CM

## 2024-10-06 DIAGNOSIS — F331 Major depressive disorder, recurrent, moderate: Secondary | ICD-10-CM

## 2024-10-06 DIAGNOSIS — F5105 Insomnia due to other mental disorder: Secondary | ICD-10-CM

## 2024-10-06 MED ORDER — BUSPIRONE HCL 10 MG PO TABS: 10.0000 mg | ORAL_TABLET | Freq: Three times a day (TID) | ORAL | 3 refills | Status: DC

## 2024-10-06 MED ORDER — GABAPENTIN 100 MG PO CAPS: 100.0000 mg | ORAL_CAPSULE | Freq: Three times a day (TID) | ORAL | 3 refills | Status: DC

## 2024-10-06 MED ORDER — HYDROXYZINE HCL 50 MG PO TABS: 50.0000 mg | ORAL_TABLET | Freq: Every evening | ORAL | 3 refills | Status: DC | PRN
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
## 2024-10-14 ENCOUNTER — Telehealth (HOSPITAL_COMMUNITY): Payer: Self-pay

## 2024-10-14 NOTE — Telephone Encounter (Signed)
 Patient left voice mail regarding accomodation letter that Dr. Noberto is supposed to write for her. She would like an update on the letter. Patient callback number is [phone]. Please advise. Thank you.

## 2024-10-14 NOTE — Telephone Encounter (Signed)
 Patient called and left voicemail stating her job needs additional information for the accomodation letter that sent by provider. Patient is requesting a call from provider.  Callback number [phone]

## 2024-10-15 ENCOUNTER — Telehealth (HOSPITAL_COMMUNITY): Payer: Self-pay | Admitting: Psychiatry

## 2024-10-15 NOTE — Telephone Encounter (Signed)
 Provider attempted to call the patient 3 times without success.  Voicemail left.

## 2024-10-15 NOTE — Telephone Encounter (Signed)
 Provider instructed patient to walk-in on Monday.  Provider informed patient that if she walks in and an appointment is available she will be seen and her forms to be filled out.  She endorsed understanding and agreed.

## 2024-10-19 ENCOUNTER — Encounter (HOSPITAL_COMMUNITY): Payer: Self-pay | Admitting: Psychiatry

## 2024-10-19 ENCOUNTER — Ambulatory Visit (INDEPENDENT_AMBULATORY_CARE_PROVIDER_SITE_OTHER): Admitting: Psychiatry

## 2024-10-19 VITALS — BP 115/58 | HR 75 | Temp 97.8°F | Ht 61.0 in | Wt 133.6 lb

## 2024-10-19 DIAGNOSIS — F331 Major depressive disorder, recurrent, moderate: Secondary | ICD-10-CM | POA: Diagnosis not present

## 2024-10-19 DIAGNOSIS — F411 Generalized anxiety disorder: Secondary | ICD-10-CM

## 2024-10-19 NOTE — Progress Notes (Signed)
 BH MD/PA/NP OP Progress Note  10/19/2024 12:01 PM Audrey Patterson  MRN:  [id number]  Chief Complaint: I need FMLA paperwork filled out  HPI: 23 year old female seen today for follow-up psychiatric evaluation.  She has a psychiatric history of panic disorder, anxiety, depression, and insomnia.  Currently she is managed on BuSpar  10 mg 3 times daily, hydroxyzine  50 mg as needed nightly, and gabapentin  100 mg 3 times daily.  She inform her that her medications are effective in managing her psychiatric conditions.  Today she is well-groomed, pleasant, cooperative, engaged in conversation.  Patient walked into the clinic today requesting writer fill out FMLA forms.  Patient continues to work at [employer] and notes that she continues to have issues with her anxiety and panic attacks.  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  She also reports that she is lifting items over half her weight and at times is unable to complete task.  She complains of back pain and quantifies it as 6 out of 10.  She does note that she sees a PCP for this and takes ibuprofen .  Patient informed clinical research associate that this induces panic.  She expresses shortness of breath, dizziness heart palpitations, and sweating.  Provider agreeable to filling out FMLA forms.   Patient notes that her grandfather passed away recently.  She informed clinical research associate at times it is difficult to deal with her mother as she is processing her emotions.  Patient is also stressed about finances and her job. Today provider conducted GAD-7 and patient scored a 17, at her last visit she scored an 18.  Provider also conducted PHQ-9 and patient scored a 16, at her last visit she scored a 19.  Patient notes that when she sleeps at her boyfriend's house she sleeps approximately 6 hours.  She however notes when she sleeps at home in her mother's house her sleep is a lot less.  Today she denies SI/HI/VH, mania, paranoia.  Patient endorses vaping CBD.   Friend notes that she only does it on the weekend.  She denies alcohol or illegal drug use.  Patient reports that gabapentin  makes her sleepy.  Provider encouraged patient to take 2 pills at night to help manage her sleep and 1 in the morning.  She endorsed understanding and agreed.  Patient has only been on medications for little over 2 weeks.  Provider informed patient that if medications are not effective in the near future they can be adjusted.  She endorsed understanding and agreed.  At her last visit she notes that she was not interested in antidepressant.  Provider encouraged patient to consider it.  She endorsed understanding and agreed.  No medication changes made today.  Patient agreeable to continue medication as prescribed.  FMLA forms filled out and given to patient.  Patient have refills on current medications.  At this time medication is not filled.  No other concerns noted at this time.   Visit Diagnosis:    ICD-10-CM   1. Generalized anxiety disorder  F41.1     2. Moderate episode of recurrent major depressive disorder (HCC)  F33.1       Past Psychiatric History: Panic disorder, anxiety, depression, and Insomnia   Past Medical History:  Past Medical History:  Diagnosis Date   Anxiety    Depression    No past surgical history on file.  Family Psychiatric History: mother anxiety, depression, and bipolar disorder, older sister cocaine use, younger sister anxiety and depression  Family History:  Family History  Problem Relation Age of Onset   Hypertension Mother    Aneurysm Mother    Healthy Father    Diabetes Maternal Grandfather     Social History:  Social History   Socioeconomic History   Marital status: Single    Spouse name: Not on file   Number of children: Not on file   Years of education: Not on file   Highest education level: Not on file  Occupational History   Not on file  Tobacco Use   Smoking status: Never   Smokeless tobacco: Never  Vaping Use    Vaping status: Never Used  Substance and Sexual Activity   Alcohol use: Not Currently    Comment: occ   Drug use: Not Currently    Comment: edibles maybe once per month   Sexual activity: Yes    Partners: Male    Birth control/protection: Pill  Other Topics Concern   Not on file  Social History Narrative   Not on file   Social Drivers of Health   Financial Resource Strain: Low Risk  (03/11/2024)   Overall Financial Resource Strain (CARDIA)    Difficulty of Paying Living Expenses: Not hard at all  Food Insecurity: No Food Insecurity (03/11/2024)   Hunger Vital Sign    Worried About Running Out of Food in the Last Year: Never true    Ran Out of Food in the Last Year: Never true  Transportation Needs: No Transportation Needs (03/11/2024)   PRAPARE - Administrator, Civil Service (Medical): No    Lack of Transportation (Non-Medical): No  Physical Activity: Insufficiently Active (03/11/2024)   Exercise Vital Sign    Days of Exercise per Week: 3 days    Minutes of Exercise per Session: 30 min  Stress: Stress Concern Present (03/11/2024)   [employer] of Occupational Health - Occupational Stress Questionnaire    Feeling of Stress : Rather much  Social Connections: Moderately Isolated (03/11/2024)   Social Connection and Isolation Panel    Frequency of Communication with Friends and Family: More than three times a week    Frequency of Social Gatherings with Friends and Family: More than three times a week    Attends Religious Services: 1 to 4 times per year    Active Member of [employer] or Organizations: No    Attends Engineer, Structural: Never    Marital Status: Never married    Allergies: No Known Allergies  Metabolic Disorder Labs: No results found for: HGBA1C, MPG No results found for: PROLACTIN No results found for: CHOL, TRIG, HDL, CHOLHDL, VLDL, LDLCALC No results found for: TSH  Therapeutic Level Labs: No results found for:  LITHIUM No results found for: VALPROATE No results found for: CBMZ  Current Medications: Current Outpatient Medications  Medication Sig Dispense Refill   busPIRone  (BUSPAR ) 10 MG tablet Take 1 tablet (10 mg total) by mouth 3 (three) times daily. 90 tablet 3   gabapentin  (NEURONTIN ) 100 MG capsule Take 1 capsule (100 mg total) by mouth 3 (three) times daily. 90 capsule 3   hydrOXYzine  (ATARAX ) 50 MG tablet Take 1 tablet (50 mg total) by mouth at bedtime as needed. 30 tablet 3   norethindrone-ethinyl estradiol-FE (JUNEL FE 1/20) 1-20 MG-MCG tablet Take 1 tablet by mouth daily. 84 tablet 4   phenazopyridine  (PYRIDIUM ) 200 MG tablet Take 1 tablet (200 mg total) by mouth 3 (three) times daily. 6 tablet 0   No current facility-administered  medications for this visit.     Musculoskeletal: Strength & Muscle Tone: within normal limits Gait & Station: normal Patient leans: N/A  Psychiatric Specialty Exam: Review of Systems  There were no vitals taken for this visit.There is no height or weight on file to calculate BMI.  General Appearance: Well Groomed  Eye Contact:  Good  Speech:  Clear and Coherent and Normal Rate  Volume:  Normal  Mood:  Anxious and Depressed  Affect:  Appropriate and Congruent  Thought Process:  Coherent, Goal Directed, and Linear  Orientation:  Full (Time, Place, and Person)  Thought Content: WDL and Logical   Suicidal Thoughts:  No  Homicidal Thoughts:  No  Memory:  Immediate;   Good Recent;   Good Remote;   Poor  Judgement:  Good  Insight:  Good  Psychomotor Activity:  Normal  Concentration:  Concentration: Good and Attention Span: Good  Recall:  Good  Fund of Knowledge: Good  Language: Good  Akathisia:  No  Handed:  Right  AIMS (if indicated): not done  Assets:  Communication Skills Desire for Improvement Financial Resources/Insurance Housing Leisure Time Physical Health Social Support Talents/Skills  ADL's:  Intact  Cognition: WNL   Sleep:  Fair   Screenings: GAD-7    Flowsheet Row Clinical Support from 10/19/2024 in [facility] Office Visit from 10/06/2024 in [facility] Counselor from 09/30/2024 in [facility] Office Visit from 03/11/2024 in [facility] Office Visit from 10/22/2022 in [facility] [place]  Total GAD-7 Score 17 18 14 13 1    PHQ2-9    Flowsheet Row Clinical Support from 10/19/2024 in [facility] Office Visit from 10/06/2024 in [facility] Counselor from 09/30/2024 in [facility] Office Visit from 03/11/2024 in [facility] Primary Care at [facility] Office Visit from 10/22/2022 in [facility] Total Score 4 5 4 2  0  PHQ-9 Total Score 16 19 18 8 6    Flowsheet Row Office Visit from 10/06/2024 in [facility] ED from 09/29/2024 in [facility] UC from 04/14/2024 in [facility] at [facility])  C-SSRS RISK CATEGORY Error: Q7 should not be populated when Q6 is No Moderate Risk No Risk     Assessment and Plan: Patient continues to express anxiety and depression.  She however walked into the clinic to have writer fill out [employer] paperwork.Patient reports that gabapentin  makes her sleepy.  Provider encouraged patient to take 2 pills at night to help manage her sleep and 1 in the morning.  She endorsed understanding and agreed.  Patient has only been on medications for little over 2 weeks.  Provider informed patient that if medications are not effective in the near future they can be adjusted.  She endorsed understanding and agreed.  At her last visit she notes that she was not interested in antidepressant.  Provider encouraged patient to consider it.  She  endorsed understanding and agreed.  No medication changes made today.  Patient agreeable to continue medication as prescribed.  FMLA forms filled out and given to patient.  Patient have refills on current medications.  At this time medication is not filled  1. Generalized anxiety disorder (Primary)   2. Moderate episode of recurrent major depressive disorder (HCC)    Collaboration of  Care: Collaboration of Care: Other provider involved in patient's care AEB PCP and counselor  Patient/Guardian was advised Release of Information must be obtained prior to any record release in order to collaborate their care with an outside provider. Patient/Guardian was advised if they have not already done so to contact the registration department to sign all necessary forms in order for us  to release information regarding their care.   Consent: Patient/Guardian gives verbal consent for treatment and assignment of benefits for services provided during this visit. Patient/Guardian expressed understanding and agreed to proceed.   Follow-up in 2 months Follow-up with therapy Zane FORBES Bach, NP 10/19/2024, 12:01 PM

## 2024-10-26 ENCOUNTER — Encounter (HOSPITAL_COMMUNITY): Payer: Self-pay

## 2024-10-26 ENCOUNTER — Ambulatory Visit (HOSPITAL_COMMUNITY): Admitting: Clinical

## 2024-11-10 ENCOUNTER — Encounter: Admitting: Family

## 2024-11-10 NOTE — Progress Notes (Signed)
 Erroneous encounter-disregard

## 2024-11-18 ENCOUNTER — Telehealth (HOSPITAL_COMMUNITY): Payer: Self-pay | Admitting: Psychiatry

## 2024-11-18 NOTE — Telephone Encounter (Signed)
 Patient came in for a walk in. Patient states that she is having trouble with her medication. The medications are not working. She also needs additional paperwork for her job accommodations. [phone] is the best number to reach her. Please advise. Thank you.

## 2024-11-19 ENCOUNTER — Encounter (HOSPITAL_COMMUNITY): Payer: Self-pay | Admitting: Psychiatry

## 2024-11-19 ENCOUNTER — Telehealth (INDEPENDENT_AMBULATORY_CARE_PROVIDER_SITE_OTHER): Admitting: Psychiatry

## 2024-11-19 DIAGNOSIS — F5105 Insomnia due to other mental disorder: Secondary | ICD-10-CM

## 2024-11-19 DIAGNOSIS — F411 Generalized anxiety disorder: Secondary | ICD-10-CM | POA: Diagnosis not present

## 2024-11-19 DIAGNOSIS — F331 Major depressive disorder, recurrent, moderate: Secondary | ICD-10-CM | POA: Diagnosis not present

## 2024-11-19 DIAGNOSIS — F99 Mental disorder, not otherwise specified: Secondary | ICD-10-CM | POA: Diagnosis not present

## 2024-11-19 MED ORDER — HYDROXYZINE HCL 50 MG PO TABS: 50.0000 mg | ORAL_TABLET | Freq: Three times a day (TID) | ORAL | 3 refills | Status: AC | PRN

## 2024-11-19 MED ORDER — BUSPIRONE HCL 10 MG PO TABS: 10.0000 mg | ORAL_TABLET | Freq: Three times a day (TID) | ORAL | 3 refills | Status: AC

## 2024-11-19 MED ORDER — SERTRALINE HCL 25 MG PO TABS: 25.0000 mg | ORAL_TABLET | Freq: Every day | ORAL | 3 refills | Status: AC

## 2024-11-19 NOTE — Telephone Encounter (Signed)
 Forms filled out and patient informed that she can pick them up at her convinced.

## 2024-11-19 NOTE — Progress Notes (Signed)
 BH MD/PA/NP OP Progress Note Virtual Visit via Video Note  I connected with Audrey Patterson on 11/19/2024 at  4:00 PM EST by a video enabled telemedicine application and verified that I am speaking with the correct person using two identifiers.  Location: Patient: Home Provider: Clinic   I discussed the limitations of evaluation and management by telemedicine and the availability of in person appointments. The patient expressed understanding and agreed to proceed.  I provided 30 minutes of non-face-to-face time during this encounter.    11/19/2024 1:58 PM Audrey Patterson  MRN:  [id number]  Chief Complaint: I don't think my medications are working  HPI: 24 year old female seen today for follow-up psychiatric evaluation.  She has a psychiatric history of panic disorder, anxiety, depression, and insomnia.  Currently she is managed on BuSpar  10 mg 3 times daily, hydroxyzine  50 mg as needed nightly, and gabapentin  100 mg 3 times daily.  She inform her that her she no longer takes gabapentin  and reports that her other medications are not effective in managing her psychiatric condition.    Today she is well-groomed, pleasant, cooperative, engaged in conversation.  Patient informed clinical research associate that she is uncertain if her medications are working.  She notes that she continues to be anxious and depressed.  Patient notes that her living situation makes her mental health.  Patient notes that she plans to move out this month but notes that her brother will no longer cosign for her.  She continues to live with her mother and notes that the environment is stressful.  She informed clinical research associate that her mother can be argumentative.  Patient notes that she does find peace when she is with her significant other.  Patient also notes that  financially she is stressed.  She has not returned to work. She asked clinical research associate to fill out return to work formed. Provider was agreeable.  Patient note that the above worsen her anxiety  and depression. Today provider conducted a GAD 7 and patient scored a 16, at her last visit she scored a 17. Provider also conducted PHQ-9 and patient scored a 16, at her last visit she scored a 16.   Today she denies SI/HI/VAH, mania, paranoia.  Patient endorses using CBD gummies.  She denies other substance use.   Today patient agreeable to starting Zoloft  25 mg daily to manage anxiety and depression.  Patient also agreeable to taking hydroxyzine  50 mg 3 times daily to help with anxiety.  She will continue BuSpar  as prescribed. Potential side effects of medication and risks vs benefits of treatment vs non-treatment were explained and discussed. All questions were answered. No other concerns noted at this time.   Visit Diagnosis:    ICD-10-CM   1. Generalized anxiety disorder  F41.1 busPIRone  (BUSPAR ) 10 MG tablet    hydrOXYzine  (ATARAX ) 50 MG tablet    sertraline  (ZOLOFT ) 25 MG tablet    2. Moderate episode of recurrent major depressive disorder (HCC)  F33.1 busPIRone  (BUSPAR ) 10 MG tablet    sertraline  (ZOLOFT ) 25 MG tablet    3. Insomnia due to other mental disorder  F51.05 hydrOXYzine  (ATARAX ) 50 MG tablet   F99        Past Psychiatric History: Panic disorder, anxiety, depression, and Insomnia   Past Medical History:  Past Medical History:  Diagnosis Date   Anxiety    Depression    No past surgical history on file.  Family Psychiatric History: mother anxiety, depression, and bipolar disorder, older sister cocaine use, younger  sister anxiety and depression   Family History:  Family History  Problem Relation Age of Onset   Hypertension Mother    Aneurysm Mother    Healthy Father    Diabetes Maternal Grandfather     Social History:  Social History   Socioeconomic History   Marital status: Single    Spouse name: Not on file   Number of children: Not on file   Years of education: Not on file   Highest education level: Not on file  Occupational History   Not on file   Tobacco Use   Smoking status: Never   Smokeless tobacco: Never  Vaping Use   Vaping status: Never Used  Substance and Sexual Activity   Alcohol use: Not Currently    Comment: occ   Drug use: Not Currently    Comment: edibles maybe once per month   Sexual activity: Yes    Partners: Male    Birth control/protection: Pill  Other Topics Concern   Not on file  Social History Narrative   Not on file   Social Drivers of Health   Tobacco Use: Low Risk (10/19/2024)   Patient History    Smoking Tobacco Use: Never    Smokeless Tobacco Use: Never    Passive Exposure: Not on file  Financial Resource Strain: Low Risk (03/11/2024)   Overall Financial Resource Strain (CARDIA)    Difficulty of Paying Living Expenses: Not hard at all  Food Insecurity: No Food Insecurity (03/11/2024)   Hunger Vital Sign    Worried About Running Out of Food in the Last Year: Never true    Ran Out of Food in the Last Year: Never true  Transportation Needs: No Transportation Needs (03/11/2024)   PRAPARE - Administrator, Civil Service (Medical): No    Lack of Transportation (Non-Medical): No  Physical Activity: Insufficiently Active (03/11/2024)   Exercise Vital Sign    Days of Exercise per Week: 3 days    Minutes of Exercise per Session: 30 min  Stress: Stress Concern Present (03/11/2024)   [employer] of Occupational Health - Occupational Stress Questionnaire    Feeling of Stress : Rather much  Social Connections: Moderately Isolated (03/11/2024)   Social Connection and Isolation Panel    Frequency of Communication with Friends and Family: More than three times a week    Frequency of Social Gatherings with Friends and Family: More than three times a week    Attends Religious Services: 1 to 4 times per year    Active Member of Clubs or Organizations: No    Attends Banker Meetings: Never    Marital Status: Never married  Depression (PHQ2-9): High Risk (11/19/2024)   Depression  (PHQ2-9)    PHQ-2 Score: 16  Alcohol Screen: Low Risk (03/11/2024)   Alcohol Screen    Last Alcohol Screening Score (AUDIT): 2  Housing: Unknown (03/11/2024)   Housing Stability Vital Sign    Unable to Pay for Housing in the Last Year: No    Number of Times Moved in the Last Year: Not on file    Homeless in the Last Year: No  Utilities: Not At Risk (03/11/2024)   AHC Utilities    Threatened with loss of utilities: No  Health Literacy: Adequate Health Literacy (03/11/2024)   B1300 Health Literacy    Frequency of need for help with medical instructions: Never    Allergies: No Known Allergies  Metabolic Disorder Labs: No results found for: HGBA1C, MPG No  results found for: PROLACTIN No results found for: CHOL, TRIG, HDL, CHOLHDL, VLDL, LDLCALC No results found for: TSH  Therapeutic Level Labs: No results found for: LITHIUM No results found for: VALPROATE No results found for: CBMZ  Current Medications: Current Outpatient Medications  Medication Sig Dispense Refill   sertraline  (ZOLOFT ) 25 MG tablet Take 1 tablet (25 mg total) by mouth daily. 30 tablet 3   busPIRone  (BUSPAR ) 10 MG tablet Take 1 tablet (10 mg total) by mouth 3 (three) times daily. 90 tablet 3   hydrOXYzine  (ATARAX ) 50 MG tablet Take 1 tablet (50 mg total) by mouth 3 (three) times daily as needed. 90 tablet 3   norethindrone-ethinyl estradiol-FE (JUNEL FE 1/20) 1-20 MG-MCG tablet Take 1 tablet by mouth daily. 84 tablet 4   phenazopyridine  (PYRIDIUM ) 200 MG tablet Take 1 tablet (200 mg total) by mouth 3 (three) times daily. 6 tablet 0   No current facility-administered medications for this visit.     Musculoskeletal: Strength & Muscle Tone: within normal limits Gait & Station: normal Patient leans: N/A  Psychiatric Specialty Exam: Review of Systems  There were no vitals taken for this visit.There is no height or weight on file to calculate BMI.  General Appearance: Well Groomed  Eye  Contact:  Good  Speech:  Clear and Coherent and Normal Rate  Volume:  Normal  Mood:  Anxious and Depressed  Affect:  Appropriate and Congruent  Thought Process:  Coherent, Goal Directed, and Linear  Orientation:  Full (Time, Place, and Person)  Thought Content: WDL and Logical   Suicidal Thoughts:  No  Homicidal Thoughts:  No  Memory:  Immediate;   Good Recent;   Good Remote;   Poor  Judgement:  Good  Insight:  Good  Psychomotor Activity:  Normal  Concentration:  Concentration: Good and Attention Span: Good  Recall:  Good  Fund of Knowledge: Good  Language: Good  Akathisia:  No  Handed:  Right  AIMS (if indicated): not done  Assets:  Communication Skills Desire for Improvement Financial Resources/Insurance Housing Leisure Time Physical Health Social Support Talents/Skills  ADL's:  Intact  Cognition: WNL  Sleep:  Fair   Screenings: GAD-7    Flowsheet Row Video Visit from 11/19/2024 in [facility] Clinical Support from 10/19/2024 in [facility] Office Visit from 10/06/2024 in [facility] Counselor from 09/30/2024 in [facility] Office Visit from 03/11/2024 in [facility] at [facility]  Total GAD-7 Score 16 17 18 14 13    PHQ2-9    Flowsheet Row Video Visit from 11/19/2024 in [facility] Clinical Support from 10/19/2024 in [facility] Office Visit from 10/06/2024 in [facility] Counselor from 09/30/2024 in [facility] Office Visit from 03/11/2024 in [facility] at [facility]  PHQ-2 Total Score 5 4 5 4 2   PHQ-9 Total Score 16 16 19 18 8    Flowsheet Row Office Visit from 10/06/2024 in [facility] ED from 09/29/2024 in [facility] UC from 04/14/2024 in [facility]  Urgent Care at [facility])  C-SSRS RISK CATEGORY Error: Q7 should not be populated when Q6 is No Moderate Risk No Risk     Assessment and Plan: Patient continues to express anxiety and depression.  Today patient agreeable to starting Zoloft  25 mg daily to manage anxiety and depression.  Patient also agreeable to taking hydroxyzine  50 mg 3 times daily to help with anxiety.  She will continue BuSpar  as prescribed.  1. Generalized anxiety disorder  Continue- busPIRone  (BUSPAR ) 10 MG tablet; Take 1 tablet (10 mg total) by mouth 3 (three) times daily.  Dispense: 90 tablet; Refill: 3 Increased- hydrOXYzine  (ATARAX ) 50 MG tablet; Take 1 tablet (50 mg total) by mouth 3 (three) times daily as needed.  Dispense: 90 tablet; Refill: 3 Start- sertraline  (ZOLOFT ) 25 MG tablet; Take 1 tablet (25 mg total) by mouth daily.  Dispense: 30 tablet; Refill: 3  2. Moderate episode of recurrent major depressive disorder (HCC)  Continue- busPIRone  (BUSPAR ) 10 MG tablet; Take 1 tablet (10 mg total) by mouth 3 (three) times daily.  Dispense: 90 tablet; Refill: 3 Start- sertraline  (ZOLOFT ) 25 MG tablet; Take 1 tablet (25 mg total) by mouth daily.  Dispense: 30 tablet; Refill: 3  3. Insomnia due to other mental disorder  Increased- hydrOXYzine  (ATARAX ) 50 MG tablet; Take 1 tablet (50 mg total) by mouth 3 (three) times daily as needed.  Dispense: 90 tablet; Refill: 3    Collaboration of Care: Collaboration of Care: Other provider involved in patient's care AEB PCP and counselor  Patient/Guardian was advised Release of Information must be obtained prior to any record release in order to collaborate their care with an outside provider. Patient/Guardian was advised if they have not already done so to contact the registration department to sign all necessary forms in order for us  to release information regarding their care.   Consent: Patient/Guardian gives verbal consent for treatment and assignment of  benefits for services provided during this visit. Patient/Guardian expressed understanding and agreed to proceed.   Follow-up in 2.5 months Follow-up with therapy Zane FORBES Bach, NP 11/19/2024, 1:58 PM

## 2024-12-03 ENCOUNTER — Telehealth (HOSPITAL_COMMUNITY): Payer: Self-pay | Admitting: Psychiatry

## 2024-12-03 NOTE — Telephone Encounter (Signed)
 [employer] called in regard to this patient. [employer] has paperwork that documents the accomodations that the patient needs, but the patient is stating that she no longer needs some of them. They need documentation that it is true that she no longer needs them. [employer] phone number is [phone], option 1. Please advise. Thank you.

## 2024-12-10 NOTE — Telephone Encounter (Signed)
 Provider called patient 3 times without success.  Voicemail not set up at this time.  Patient can call the clinic for further questions or concerns.

## 2024-12-18 ENCOUNTER — Other Ambulatory Visit: Payer: Self-pay

## 2024-12-18 ENCOUNTER — Emergency Department (HOSPITAL_COMMUNITY)
Admission: EM | Admit: 2024-12-18 | Discharge: 2024-12-18 | Disposition: A | Discharge status: Home / Self Care | Source: Home / Self Care | Attending: Emergency Medicine | Admitting: Emergency Medicine

## 2024-12-18 ENCOUNTER — Encounter (HOSPITAL_COMMUNITY): Payer: Self-pay

## 2024-12-18 ENCOUNTER — Emergency Department (HOSPITAL_COMMUNITY)

## 2024-12-18 DIAGNOSIS — S39012A Strain of muscle, fascia and tendon of lower back, initial encounter: Secondary | ICD-10-CM

## 2024-12-18 LAB — URINALYSIS, ROUTINE W REFLEX MICROSCOPIC
Bilirubin Urine: NEGATIVE
Glucose, UA: NEGATIVE mg/dL
Hgb urine dipstick: NEGATIVE
Ketones, ur: NEGATIVE mg/dL
Nitrite: NEGATIVE
Protein, ur: NEGATIVE mg/dL
Specific Gravity, Urine: 1.019 (ref 1.005–1.030)
pH: 6 (ref 5.0–8.0)

## 2024-12-18 LAB — PREGNANCY, URINE: Preg Test, Ur: NEGATIVE

## 2024-12-18 MED ORDER — IBUPROFEN 600 MG PO TABS: 600.0000 mg | ORAL_TABLET | Freq: Four times a day (QID) | ORAL | 0 refills | Status: AC | PRN

## 2024-12-18 MED ORDER — METHOCARBAMOL 500 MG PO TABS: 500.0000 mg | ORAL_TABLET | Freq: Two times a day (BID) | ORAL | 0 refills | Status: AC | PRN

## 2024-12-18 NOTE — ED Notes (Signed)
 Patient provided specimen cup for urine collection

## 2024-12-18 NOTE — ED Provider Notes (Signed)
 " [facility] Provider Note   CSN: [id number] Arrival date & time: 12/18/24  0700     Patient presents with: Back Pain   Audrey Patterson is a 24 y.o. female.   Pt is a 24 yo female with pmhx significant for depression and anxiety.  She woke up this am around 0500 with severe back spasms.  She said they are bad and then they go away.  It happened twice more, so she thought she'd come in for eval.  She denies any radiation of pain.  No abd pain.  No urinary sx.  No leg pain or numbness.  She does work for [employer] and for [employer] and lifts a lot of heavy boxes.  Pain is gone now.       Prior to Admission medications  Medication Sig Start Date End Date Taking? Authorizing Provider  ibuprofen  (ADVIL ) 600 MG tablet Take 1 tablet (600 mg total) by mouth every 6 (six) hours as needed. 12/18/24  Yes Dean Clarity, MD  methocarbamol  (ROBAXIN ) 500 MG tablet Take 1 tablet (500 mg total) by mouth 2 (two) times daily as needed. 12/18/24  Yes Dean Clarity, MD  busPIRone  (BUSPAR ) 10 MG tablet Take 1 tablet (10 mg total) by mouth 3 (three) times daily. 11/19/24   Harl Zane BRAVO, NP  hydrOXYzine  (ATARAX ) 50 MG tablet Take 1 tablet (50 mg total) by mouth 3 (three) times daily as needed. 11/19/24   Parsons, Brittney E, NP  norethindrone-ethinyl estradiol-FE (JUNEL FE 1/20) 1-20 MG-MCG tablet Take 1 tablet by mouth daily. 01/08/24   Leftwich-Kirby, Olam LABOR, CNM  phenazopyridine  (PYRIDIUM ) 200 MG tablet Take 1 tablet (200 mg total) by mouth 3 (three) times daily. 03/24/24   Piontek, Rocky, MD  sertraline  (ZOLOFT ) 25 MG tablet Take 1 tablet (25 mg total) by mouth daily. 11/19/24   Harl Zane BRAVO, NP    Allergies: Patient has no known allergies.    Review of Systems  Musculoskeletal:  Positive for back pain.  All other systems reviewed and are negative.   Updated Vital Signs BP 129/81 (BP Location: Right Arm)   Pulse (!) 111   Temp 98.4 F (36.9 C)    Resp 16   SpO2 99%   Physical Exam Vitals and nursing note reviewed.  Constitutional:      Appearance: Normal appearance.  HENT:     Head: Normocephalic and atraumatic.     Right Ear: External ear normal.     Left Ear: External ear normal.     Nose: Nose normal.     Mouth/Throat:     Mouth: Mucous membranes are moist.     Pharynx: Oropharynx is clear.  Eyes:     Extraocular Movements: Extraocular movements intact.     Conjunctiva/sclera: Conjunctivae normal.     Pupils: Pupils are equal, round, and reactive to light.  Cardiovascular:     Rate and Rhythm: Normal rate and regular rhythm.     Pulses: Normal pulses.     Heart sounds: Normal heart sounds.  Pulmonary:     Effort: Pulmonary effort is normal.     Breath sounds: Normal breath sounds.  Musculoskeletal:        General: Normal range of motion.     Cervical back: Normal range of motion and neck supple.  Skin:    General: Skin is warm.     Capillary Refill: Capillary refill takes less than 2 seconds.  Neurological:  General: No focal deficit present.     Mental Status: She is alert and oriented to person, place, and time.  Psychiatric:        Mood and Affect: Mood normal.        Behavior: Behavior normal.     (all labs ordered are listed, but only abnormal results are displayed) Labs Reviewed  URINALYSIS, ROUTINE W REFLEX MICROSCOPIC - Abnormal; Notable for the following components:      Result Value   APPearance HAZY (*)    Leukocytes,Ua SMALL (*)    Bacteria, UA RARE (*)    All other components within normal limits  PREGNANCY, URINE    EKG: None  Radiology: DG Lumbar Spine Complete Result Date: 12/18/2024 CLINICAL DATA:  Back pain and spasms. EXAM: LUMBAR SPINE - COMPLETE 4+ VIEW COMPARISON:  03/11/2024 FINDINGS: Alignment is within normal limits. No significant disc or vertebral body height loss. Visualized soft tissues are normal. IMPRESSION: No radiographic abnormality of the lumbar spine.  Electronically Signed   By: Aliene Lloyd M.D.   On: 12/18/2024 09:11     Procedures   Medications Ordered in the ED - No data to display                                  Medical Decision Making Amount and/or Complexity of Data Reviewed Labs: ordered. Radiology: ordered.  Risk Prescription drug management.   This patient presents to the ED for concern of back pain, this involves an extensive number of treatment options, and is a complaint that carries with it a high risk of complications and morbidity.  The differential diagnosis includes strain, pregnancy, kidney stone, pyelo   Co morbidities that complicate the patient evaluation  Depression and anxiety   Additional history obtained:  Additional history obtained from epic chart review   Lab Tests:  I Ordered, and personally interpreted labs.  The pertinent results include:  ua neg, preg neg   Imaging Studies ordered:  I ordered imaging studies including lumbar spine  I independently visualized and interpreted imaging which showed neg I agree with the radiologist interpretation   Medicines ordered and prescription drug management:  I have reviewed the patients home medicines and have made adjustments as needed  Problem List / ED Course:  Lumbar strain:  pain has improved now and she did not want anything for pain here.  She is given exercises to do and is d/c with robaxin  and ibuprofen .  She is to return if worse.  F/u with pcp.   Reevaluation:  After the interventions noted above, I reevaluated the patient and found that they have :improved   Social Determinants of Health:  Lives at home   Dispostion:  After consideration of the diagnostic results and the patients response to treatment, I feel that the patent would benefit from discharge with outpatient f/u.       Final diagnoses:  Strain of lumbar region, initial encounter    ED Discharge Orders          Ordered    methocarbamol  (ROBAXIN )  500 MG tablet  2 times daily PRN        12/18/24 0924    ibuprofen  (ADVIL ) 600 MG tablet  Every 6 hours PRN        12/18/24 0924               Dean Clarity, MD 12/18/24 [phone]  "

## 2024-12-18 NOTE — ED Notes (Signed)
 Patient discharge paperwork reviewed with patient, no questions from patient. Ambulatory to the emergency exit.

## 2024-12-18 NOTE — ED Triage Notes (Addendum)
 Pt reports she has been having ongoing back spasms since this morning at 0500, woke her up from sleep, felt like she was being stabbed. Reports back pain is currently not present.

## 2024-12-18 NOTE — ED Notes (Signed)
 Patient transported to X-ray

## 2024-12-24 ENCOUNTER — Encounter (HOSPITAL_COMMUNITY): Admitting: Psychiatry

## 2025-01-28 ENCOUNTER — Encounter (HOSPITAL_COMMUNITY): Admitting: Psychiatry
